# Patient Record
Sex: Female | Born: 1990 | Race: White | Hispanic: No | Marital: Married | Smoking: Current every day smoker
Health system: Southern US, Community
[De-identification: ages and names within clinical notes are randomized; demographics above are authoritative.]

## PROBLEM LIST (undated history)

## (undated) DIAGNOSIS — Z803 Family history of malignant neoplasm of breast: Secondary | ICD-10-CM

## (undated) DIAGNOSIS — G4730 Sleep apnea, unspecified: Secondary | ICD-10-CM

## (undated) DIAGNOSIS — E119 Type 2 diabetes mellitus without complications: Secondary | ICD-10-CM

## (undated) DIAGNOSIS — E282 Polycystic ovarian syndrome: Secondary | ICD-10-CM

## (undated) DIAGNOSIS — E669 Obesity, unspecified: Secondary | ICD-10-CM

## (undated) HISTORY — DX: Type 2 diabetes mellitus without complications: E11.9

## (undated) HISTORY — DX: Family history of malignant neoplasm of breast: Z80.3

## (undated) HISTORY — DX: Polycystic ovarian syndrome: E28.2

## (undated) HISTORY — DX: Sleep apnea, unspecified: G47.30

## (undated) HISTORY — DX: Obesity, unspecified: E66.9

---
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
## 2011-02-04 ENCOUNTER — Emergency Department (HOSPITAL_BASED_OUTPATIENT_CLINIC_OR_DEPARTMENT_OTHER)
Admission: EM | Admit: 2011-02-04 | Discharge: 2011-02-04 | Disposition: A | Discharge status: Home / Self Care | Attending: Emergency Medicine | Admitting: Emergency Medicine

## 2011-02-04 DIAGNOSIS — J020 Streptococcal pharyngitis: Secondary | ICD-10-CM | POA: Insufficient documentation

## 2011-02-04 DIAGNOSIS — F17200 Nicotine dependence, unspecified, uncomplicated: Secondary | ICD-10-CM | POA: Insufficient documentation

## 2011-02-04 DIAGNOSIS — R509 Fever, unspecified: Secondary | ICD-10-CM | POA: Insufficient documentation

## 2011-02-04 DIAGNOSIS — Z79899 Other long term (current) drug therapy: Secondary | ICD-10-CM | POA: Insufficient documentation

## 2011-02-04 DIAGNOSIS — J45909 Unspecified asthma, uncomplicated: Secondary | ICD-10-CM | POA: Insufficient documentation

## 2011-02-04 LAB — RAPID STREP SCREEN (MED CTR MEBANE ONLY): Streptococcus, Group A Screen (Direct): POSITIVE — AB

## 2011-02-04 MED ORDER — IBUPROFEN 800 MG PO TABS
ORAL_TABLET | ORAL | Status: AC
  Administered 2011-02-04: 800 mg
  Filled 2011-02-04: qty 1

## 2011-02-04 MED ORDER — PENICILLIN V POTASSIUM 500 MG PO TABS: 500.0000 mg | ORAL_TABLET | Freq: Four times a day (QID) | ORAL | Status: AC

## 2011-02-04 MED ORDER — IBUPROFEN 800 MG PO TABS: 800.0000 mg | ORAL_TABLET | Freq: Three times a day (TID) | ORAL | Status: AC

## 2011-02-04 NOTE — ED Provider Notes (Signed)
History     CSN: [id number]  Arrival date & time 02/04/11  1130   None     Chief Complaint  Patient presents with  . Fever  . Sore Throat  . Generalized Body Aches  . Dizziness    (Consider location/radiation/quality/duration/timing/severity/associated sxs/prior treatment) Patient is a 20 y.o. female presenting with pharyngitis. The history is provided by the patient. No language interpreter was used.  Sore Throat This is a new problem. The current episode started yesterday. The problem occurs constantly. The problem has been gradually worsening. Associated symptoms include fatigue, a fever, headaches and a sore throat. The symptoms are aggravated by nothing. She has tried acetaminophen for the symptoms. The treatment provided no relief.    Past Medical History  Diagnosis Date  . Asthma     History reviewed. No pertinent past surgical history.  No family history on file.  History  Substance Use Topics  . Smoking status: Current Everyday Smoker -- 0.5 packs/day  . Smokeless tobacco: Not on file  . Alcohol Use: No    OB History    Grav Para Term Preterm Abortions TAB SAB Ect Mult Living                  Review of Systems  Constitutional: Positive for fever and fatigue.  HENT: Positive for sore throat.   Neurological: Positive for headaches.  All other systems reviewed and are negative.    Allergies  Review of patient's allergies indicates no known allergies.  Home Medications   Current Outpatient Rx  Name Route Sig Dispense Refill  . ACETAMINOPHEN 500 MG PO TABS Oral Take 1,000 mg by mouth every 6 (six) hours as needed.      Maximino Greenland 18-103 MCG/ACT IN AERO Inhalation Inhale 2 puffs into the lungs every 6 (six) hours as needed.      Marland Kitchen FLUTICASONE-SALMETEROL 115-21 MCG/ACT IN AERO Inhalation Inhale 2 puffs into the lungs 2 (two) times daily.      . IBUPROFEN 800 MG PO TABS Oral Take 800 mg by mouth every 8 (eight) hours as needed.         BP 127/74  Pulse 128  Temp(Src) 101.8 F (38.8 C) (Oral)  Resp 18  Ht 5\' 2"  (1.575 m)  Wt 194 lb (87.998 kg)  BMI 35.48 kg/m2  SpO2 99%  LMP 01/07/2011  Physical Exam  Nursing note and vitals reviewed. Constitutional: She is oriented to person, place, and time. She appears well-developed.  HENT:  Head: Normocephalic.  Right Ear: External ear normal.  Mouth/Throat: Oropharyngeal exudate present.       Throat erythematous   Eyes: Conjunctivae are normal. Pupils are equal, round, and reactive to light.  Neck: Normal range of motion. Neck supple.  Cardiovascular: Normal rate and normal heart sounds.   Pulmonary/Chest: Effort normal.  Abdominal: Soft.  Musculoskeletal: Normal range of motion.  Neurological: She is alert and oriented to person, place, and time. She has normal reflexes.  Skin: Skin is warm.  Psychiatric: She has a normal mood and affect.    ED Course  Procedures (including critical care time)  Labs Reviewed - No data to display No results found.   No diagnosis found.    MDM  Strep positive,  Temp decreased with ibuprofen        Langston Masker, [place] 02/04/11 1440

## 2011-02-04 NOTE — ED Notes (Signed)
Trisha Mangle, PA-C at bedside for evaluation.

## 2011-02-04 NOTE — ED Notes (Signed)
Pt reports sore throat, fever, dizziness and generalized body aches that started yesterday.

## 2011-02-06 NOTE — ED Provider Notes (Signed)
Medical screening examination/treatment/procedure(s) were performed by non-physician practitioner and as supervising physician I was immediately available for consultation/collaboration.   Geoffery Lyons, MD 02/06/11 [phone]

## 2012-10-09 HISTORY — PX: TUBAL LIGATION: SHX77

## 2013-07-09 HISTORY — PX: CHOLECYSTECTOMY: SHX55

## 2015-02-09 DIAGNOSIS — E119 Type 2 diabetes mellitus without complications: Secondary | ICD-10-CM

## 2015-02-09 HISTORY — DX: Type 2 diabetes mellitus without complications: E11.9

## 2016-04-07 ENCOUNTER — Ambulatory Visit (INDEPENDENT_AMBULATORY_CARE_PROVIDER_SITE_OTHER): Admitting: Advanced Practice Midwife

## 2016-04-07 ENCOUNTER — Encounter: Payer: Self-pay | Admitting: Advanced Practice Midwife

## 2016-04-07 VITALS — BP 110/70 | HR 84 | Ht 62.0 in | Wt 227.0 lb

## 2016-04-07 DIAGNOSIS — Z8742 Personal history of other diseases of the female genital tract: Secondary | ICD-10-CM

## 2016-04-07 DIAGNOSIS — Z87898 Personal history of other specified conditions: Secondary | ICD-10-CM | POA: Diagnosis not present

## 2016-04-07 DIAGNOSIS — Z01419 Encounter for gynecological examination (general) (routine) without abnormal findings: Secondary | ICD-10-CM

## 2016-04-07 NOTE — Progress Notes (Signed)
Gynecology Annual Exam  PCP: Evelene CroonNIEMEYER, MEINDERT, MD  Chief Complaint:  Chief Complaint  Patient presents with  . Gynecologic Exam    History of Present Illness: Patient is a 26 y.o. G3P3003 presents for annual exam. The patient has no complaints today.   LMP: Patient's last menstrual period was 04/04/2016. Menarche:not applicable Interval: every month Duration of flow: 4 days Heavy Menses: no Clots: no Intermenstrual Bleeding: no Postcoital Bleeding: no Dysmenorrhea: no   The patient is sexually active. She currently uses Female sterilization: tubal ligation for contraception. The patient wears seatbelts: yes.   last pap: normal following colposcopy February 2017  The patient has regular exercise: yes.  The patient has ever been transfused or tattooed?: not asked.  The patient reports that domestic violence in her life is absent.   Review of Systems: ROS  Past Medical History:  Past Medical History:  Diagnosis Date  . Asthma   . Diabetes mellitus without complication (HCC) 2017  . Obesity   . Polycystic ovaries     Past Surgical History:  Past Surgical History:  Procedure Laterality Date  . CHOLECYSTECTOMY  07/2013  . TUBAL LIGATION  10/2012    Gynecologic History:  Patient's last menstrual period was 04/04/2016. Last Pap: 2017 Results were: no abnormalities   Obstetric History: [id number]  Family History:  Family History  Problem Relation Age of Onset  . Congestive Heart Failure Mother   . Diabetes Mother   . Hypertension Mother   . Skin cancer Mother   . Congestive Heart Failure Father   . Diabetes Father   . Hypertension Father   . Breast cancer Paternal Aunt   . Colon cancer Paternal Grandfather     Social History:  Social History   Social History  . Marital status: Married    Spouse name: N/A  . Number of children: N/A  . Years of education: N/A   Occupational History  . Not on file.   Social History Main Topics  . Smoking status:  Current Every Day Smoker    Packs/day: 0.50  . Smokeless tobacco: Not on file  . Alcohol use Yes  . Drug use: No  . Sexual activity: Yes     Comment: tubal ligation   Other Topics Concern  . Not on file   Social History Narrative  . No narrative on file    Allergies:  Allergies  Allergen Reactions  . Contrast Media [Iodinated Diagnostic Agents] Nausea And Vomiting    Medications: Prior to Admission medications   Medication Sig Start Date End Date Taking? Authorizing Provider  albuterol (PROVENTIL HFA;VENTOLIN HFA) 108 (90 Base) MCG/ACT inhaler Inhale 1-2 puffs into the lungs every 6 (six) hours as needed for wheezing or shortness of breath.   Yes Historical Provider, MD  cetirizine (ZYRTEC) 10 MG tablet  04/05/16  Yes Historical Provider, MD  fluticasone-salmeterol (ADVAIR HFA) 115-21 MCG/ACT inhaler Inhale 2 puffs into the lungs 2 (two) times daily.     Yes Historical Provider, MD  ibuprofen (ADVIL,MOTRIN) 800 MG tablet Take 800 mg by mouth every 8 (eight) hours as needed.     Yes Historical Provider, MD  meloxicam (MOBIC) 15 MG tablet Take 15 mg by mouth daily.   Yes Historical Provider, MD  ondansetron (ZOFRAN) 4 MG tablet take 1-2 tablets by mouth three times a day if needed 01/15/16  Yes Historical Provider, MD  ONGLYZA 5 MG TABS tablet Take 5 mg by mouth daily. 01/20/16  Yes Historical Provider,  MD  acetaminophen (TYLENOL) 500 MG tablet Take 1,000 mg by mouth every 6 (six) hours as needed.      Historical Provider, MD  albuterol-ipratropium (COMBIVENT) 18-103 MCG/ACT inhaler Inhale 2 puffs into the lungs every 6 (six) hours as needed.      Historical Provider, MD    Physical Exam Vitals:  Vitals:   04/07/16 1138  BP: 110/70  Pulse: 84   Patient's last menstrual period was 04/04/2016.  General: NAD HEENT: normocephalic, anicteric Thyroid: no enlargement, no palpable nodules Pulmonary: No increased work of breathing, CTAB Cardiovascular: RRR, distal pulses  2+ Breast: Breast symmetrical, no tenderness, no palpable nodules or masses, no skin or nipple retraction present, no nipple discharge.  No axillary or supraclavicular lymphadenopathy. Abdomen: NABS, soft, non-tender, non-distended.  Umbilicus without lesions.  No hepatomegaly, splenomegaly or masses palpable. No evidence of hernia  Genitourinary:  External: Normal external female genitalia.  Normal urethral meatus, normal  Bartholin's and Skene's glands.    Vagina: Normal vaginal mucosa, no evidence of prolapse.    Cervix: Grossly normal in appearance, no bleeding  Uterus: Non-enlarged, mobile, normal contour.  No CMT  Adnexa: ovaries non-enlarged, no adnexal masses  Rectal: deferred  Lymphatic: no evidence of inguinal lymphadenopathy Extremities: no edema, erythema, or tenderness Neurologic: Grossly intact Psychiatric: mood appropriate, affect full   Assessment: 26 y.o. [id number] Annual well woman gyn with PAP   Plan: Problem List Items Addressed This Visit    None       1) Continue healthy lifestyle diet and exercise program  2) STI screening was offered, declined  3) Pap - ASCCP guidelines and rational discussed.  Patient opts for annual screening interval  4) Follow up 1 year for routine annual exam   Tresea Mall, CNM

## 2016-04-09 LAB — PAP IG AND HPV HIGH-RISK
HPV, high-risk: NEGATIVE
PAP SMEAR COMMENT: 0

## 2017-07-15 ENCOUNTER — Ambulatory Visit: Payer: 59 | Attending: Neurology

## 2017-07-15 DIAGNOSIS — G4733 Obstructive sleep apnea (adult) (pediatric): Secondary | ICD-10-CM | POA: Diagnosis not present

## 2017-07-29 ENCOUNTER — Encounter: Admitting: Obstetrics and Gynecology

## 2017-08-05 ENCOUNTER — Ambulatory Visit: Payer: 59 | Attending: Neurology

## 2017-08-05 DIAGNOSIS — G4733 Obstructive sleep apnea (adult) (pediatric): Secondary | ICD-10-CM | POA: Insufficient documentation

## 2017-08-19 ENCOUNTER — Encounter: Payer: Self-pay | Admitting: Obstetrics and Gynecology

## 2017-08-19 ENCOUNTER — Ambulatory Visit (INDEPENDENT_AMBULATORY_CARE_PROVIDER_SITE_OTHER): Payer: Managed Care, Other (non HMO) | Admitting: Obstetrics and Gynecology

## 2017-08-19 VITALS — BP 118/72 | HR 98 | Ht 62.0 in | Wt 234.0 lb

## 2017-08-19 DIAGNOSIS — N914 Secondary oligomenorrhea: Secondary | ICD-10-CM

## 2017-08-19 DIAGNOSIS — Z3202 Encounter for pregnancy test, result negative: Secondary | ICD-10-CM | POA: Diagnosis not present

## 2017-08-19 DIAGNOSIS — Z87898 Personal history of other specified conditions: Secondary | ICD-10-CM

## 2017-08-19 DIAGNOSIS — Z8742 Personal history of other diseases of the female genital tract: Secondary | ICD-10-CM

## 2017-08-19 LAB — POCT URINE PREGNANCY: PREG TEST UR: NEGATIVE

## 2017-08-19 MED ORDER — MEDROXYPROGESTERONE ACETATE 10 MG PO TABS: 10.0000 mg | ORAL_TABLET | Freq: Every day | ORAL | 0 refills | Status: DC

## 2017-08-19 NOTE — Progress Notes (Signed)
Obstetrics & Gynecology Office Visit   Chief Complaint  Patient presents with  . Referral    Abnormal pap smears, [facility]    History of Present Illness: 27 y.o. [phone] female who presents in referral from [facility], for a history of abnormal pap smears. She also notes that while she has PCOS, she normally has a menstruation every month.  She has not had a menstruation since April and she feels occasional lower uterine cramping. She denies abnormal discharge, vaginal symptoms. Her discomfort is just momentary and generally happens once per day.   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 Asthma   . Diabetes mellitus without complication (HCC) 2017  . Obesity   . Polycystic ovaries   . Sleep apnea     Past Surgical History:  Procedure Laterality Date  . CHOLECYSTECTOMY  07/2013  . TUBAL LIGATION  10/2012    Gynecologic History: Patient's last menstrual period was 06/01/2017 (approximate).  Obstetric History: [id number]  Family History  Problem Relation Age of Onset  . Congestive Heart Failure Mother   . Diabetes Mother   . Hypertension Mother   . Skin cancer Mother   . Congestive Heart Failure Father   . Diabetes Father   . Hypertension Father   . Breast cancer Paternal Aunt [id number]  . Colon cancer Paternal Grandfather     Social History   Socioeconomic History  . Marital status: Married    Spouse name: Not on file  . Number of children: Not on file  . Years of education: Not on file  . Highest education level: Not on file  Occupational History  . Not on file  Social Needs  . Financial resource strain: Not on file    . Food insecurity:    Worry: Not on file    Inability: Not on file  . Transportation needs:    Medical: Not on file    Non-medical: Not on file  Tobacco Use  . Smoking status: Former Smoker    Packs/day: 0.50  . Smokeless tobacco: Never Used  . Tobacco comment: quit 10/2015  Substance and Sexual Activity  . Alcohol use: Yes    Comment: occ  . Drug use: No  . Sexual activity: Yes    Comment: tubal ligation  Lifestyle  . Physical activity:    Days per week: 6 days    Minutes per session: 30 min  . Stress: Not on file  Relationships  . Social connections:    Talks on phone: Not on file    Gets together: Not on file    Attends religious service: Not on file    Active member of club or organization: Not on file    Attends meetings of clubs or organizations: Not on file    Relationship status: Not on file  . Intimate partner violence:    Fear of current or ex partner: Not on file    Emotionally abused: Not on file    Physically abused: Not on file    Forced sexual activity: Not on file  Other Topics Concern  . Not on file  Social History Narrative  .  Not on file    Allergies  Allergen Reactions  . Iodinated Diagnostic Agents Nausea And Vomiting    Contrast Dye  . Shellfish Allergy Nausea Only    Betadine OK    Prior to Admission medications   Medication Sig Start Date End Date Taking? Authorizing Provider  albuterol (PROVENTIL HFA;VENTOLIN HFA) 108 (90 Base) MCG/ACT inhaler Inhale 1-2 puffs into the lungs every 6 (six) hours as needed for wheezing or shortness of breath.   Yes [provider]  cetirizine (ZYRTEC) 10 MG tablet  04/05/16  Yes [provider]  Fluticasone-Salmeterol (ADVAIR DISKUS) 250-50 MCG/DOSE AEPB Inhale 1 puff into the lungs every 12 (twelve) hours. 03/09/17 03/09/18 Yes [provider]  metFORMIN (GLUCOPHAGE-XR) 500 MG 24 hr tablet Take 2 tablets by mouth daily. 07/07/17  Yes [provider]  montelukast  (SINGULAIR) 10 MG tablet Take 1 tablet by mouth at bedtime. 02/16/17 02/16/18 Yes [provider]  ondansetron (ZOFRAN) 4 MG tablet take 1-2 tablets by mouth three times a day if needed 01/15/16  Yes [provider]  acetaminophen (TYLENOL) 500 MG tablet Take 1,000 mg by mouth every 6 (six) hours as needed.      [provider]  ketoconazole (NIZORAL) 2 % shampoo Apply 1 application topically daily. 07/07/17   [provider]  medroxyPROGESTERone (PROVERA) 10 MG tablet Take 1 tablet (10 mg total) by mouth daily for 10 days. 08/19/17 08/29/17  Conard Novak, MD    Review of Systems  Constitutional: Negative.   HENT: Negative.   Eyes: Negative.   Respiratory: Negative.   Cardiovascular: Negative.   Gastrointestinal: Negative.   Genitourinary: Negative.        See HPI  Musculoskeletal: Negative.   Skin: Negative.   Neurological: Negative.   Psychiatric/Behavioral: Negative.      Physical Exam BP 118/72 (BP Location: Left Arm, Patient Position: Sitting, Cuff Size: Large)   Pulse 98   Ht 5\' 2"  (1.575 m)   Wt 234 lb (106.1 kg)   LMP 06/01/2017 (Approximate)   SpO2 98%   BMI 42.80 kg/m  Patient's last menstrual period was 06/01/2017 (approximate). Physical Exam  Constitutional: She is oriented to person, place, and time. She appears well-developed and well-nourished. No distress.  HENT:  Head: Normocephalic and atraumatic.  Eyes: Conjunctivae are normal. No scleral icterus.  Pulmonary/Chest: No respiratory distress.  Musculoskeletal: Normal range of motion. She exhibits no edema.  Neurological: She is alert and oriented to person, place, and time. No cranial nerve deficit.  Psychiatric: She has a normal mood and affect. Her behavior is normal. Judgment normal.   POCT urine pregnancy: negative  Assessment: 27 y.o. G79P3003 female here for  1. Secondary oligomenorrhea   2. History of abnormal cervical Pap smear      Plan: Problem List Items  Addressed This Visit      Other   History of abnormal cervical Pap smear    Other Visit Diagnoses    Secondary oligomenorrhea    -  Primary   Relevant Medications   medroxyPROGESTERone (PROVERA) 10 MG tablet   Other Relevant Orders   POCT urine pregnancy (Completed)     History of abnormal pap smear: Given her recent normal pap smears, she is fine, per ASCCP guidelines, to follow up 3 years from her pap smear in 03/2016 to repeat her pap smear.  I offered to perform a pap smear after this discussion today and she declined.  Secondary oligomenorrhea: she does have a  history of PCOS.  She has what sounds like mild uterine discomfort.  I recommended a withdrawal bleeding provoked by Provera.  She agreed to this plan. So, I ordered provera 10 mg po daily x 10 days to affect a withdrawal bleed.  Follow up per Dr. Larwance Sachs. If she continues to have irregular menses associated with PCOS, I am happy to co-manage with Dr. Larwance Sachs, if he would prefer that.  Otherwise, I defer to his management.  20 minutes spent in face to face discussion with > 50% spent in counseling,management, and coordination of care of her history of abnormal pap smears and secondary oligomenorrhea.   Thomasene Mohair, MD 08/19/2017 4:34 PM     CC: Kandyce Rud, MD [facility]

## 2017-08-29 ENCOUNTER — Encounter: Payer: Self-pay | Admitting: Obstetrics and Gynecology

## 2017-09-14 ENCOUNTER — Telehealth: Payer: Self-pay | Admitting: Obstetrics and Gynecology

## 2017-09-14 NOTE — Telephone Encounter (Signed)
Left generic VM to answer patient questions.

## 2017-09-14 NOTE — Telephone Encounter (Signed)
Pt calling [facility] back about what kind of surgery she had back in 2014

## 2017-09-14 NOTE — Telephone Encounter (Signed)
Patient wanted to know how her BTL was accomplished in 2014. I discussed that the records indicated that she has a postpartum BTL using [employer].  All questions answered.

## 2018-08-25 NOTE — H&P (Signed)
Heather Ewing is a 28 y.o. female here for Follow-up . HeatherBryantis a 28 y.o.femalehere for Rf Dr Merton Border menses .pt here for consultation for AUB . Pt with a long h/o f skipping menses in past . Heavy bleeding . She smokes 5-10 cigarettes daily. S/p BTL Told that she has PCOS . G3 P3 svd  SHe has use Mirena 2 times , last time with alot of pain and bleeding  u/s today :  Uterus anteverted  Endometrium=8.3mm  No free fluid seen  Lt ovary volume=14.45ml  Rt ovary volume=13.62ml     Past Medical History:  has a past medical history of Abnormal cytology, ADHD, Allergic state, Anxiety, Asthma, unspecified asthma severity, unspecified whether complicated, unspecified whether persistent, PCOS (polycystic ovarian syndrome), Pituitary tumor, and Sciatica.  Past Surgical History:  has a past surgical history that includes Tubal ligation; Cholecystectomy; and Cervical biopsy w/ loop electrode excision. Family History: family history includes Asthma in her brother, brother, sister, sister, sister, and sister; COPD in her mother; Cirrhosis in her father; Diabetes type II in her father, maternal grandmother, mother, and paternal grandmother; Heart disease in her father and maternal grandmother; Heart failure in her mother; High blood pressure (Hypertension) in her maternal grandmother and mother; Skin cancer in her mother; Stroke in her paternal grandmother. Social History:  reports that she has been smoking. She started smoking about 15 years ago. She has been smoking about 0.50 packs per day. She has never used smokeless tobacco. She reports current alcohol use of about 1.0 standard drinks of alcohol per week. She reports that she does not use drugs. OB/GYN History:          OB History    Gravida  3   Para  3   Term      Preterm      AB      Living  3     SAB      TAB      Ectopic      Molar      Multiple      Live Births  3           Allergies: is allergic to dye and shellfish containing products. Medications:  Current Outpatient Medications:  .  ADVAIR DISKUS 250-50 mcg/dose diskus inhaler, USE 1 INHALATION EVERY 12 HOURS, Disp: 180 each, Rfl: 1 .  cetirizine (ZYRTEC) 10 MG tablet, TAKE 1 TABLET BY MOUTH EVERY DAY, Disp: 30 tablet, Rfl: 2 .  fluticasone (FLONASE) 50 mcg/actuation nasal spray, Place 1 spray into both nostrils 2 (two) times daily, Disp: , Rfl:  .  lisdexamfetamine (VYVANSE) 60 MG capsule, Take 60 mg by mouth every morning, Disp: , Rfl:  .  metFORMIN (GLUCOPHAGE-XR) 500 MG XR tablet, Take 2 tablets (1,000 mg total) by mouth daily with dinner, Disp: 180 tablet, Rfl: 1 .  montelukast (SINGULAIR) 10 mg tablet, TAKE 1 TABLET BY MOUTH AT BEDTIME, Disp: 30 tablet, Rfl: 3 .  ondansetron (ZOFRAN-ODT) 4 MG disintegrating tablet, Take 1 tablet (4 mg total) by mouth every 8 (eight) hours as needed for Nausea, Disp: 12 tablet, Rfl: 0 .  VENTOLIN HFA 90 mcg/actuation inhaler, INHALE 2 PUFFS BY MOUTH EVERY 4 HOURS AS NEEDED, Disp: 18 Inhaler, Rfl: 2 .  busPIRone (BUSPAR) 7.5 MG tablet, Take 1 tablet (7.5 mg total) by mouth 2 (two) times daily (Patient not taking: Reported on 07/24/2018  ), Disp: 60 tablet, Rfl: 2 .  ketoconazole (NIZORAL) 2 % shampoo, Leave on for  5 minutes, then rinse. (Patient not taking: Reported on 07/24/2018  ), Disp: 120 mL, Rfl: 5 .  norgestimate-ethinyl estradioL (SPRINTEC 0.25/35, 28,) 0.25-35 mg-mcg tablet, Take 1 tablet by mouth once daily (Patient not taking: Reported on 07/24/2018  ), Disp: 1 Package, Rfl: 2 .  propranolol (INDERAL) 20 MG tablet, Take 1 tablet (20 mg total) by mouth 2 (two) times daily, Disp: 60 tablet, Rfl: 1  Review of Systems: General:                      No fatigue or weight loss Eyes:                           No vision changes Ears:                            No hearing difficulty Respiratory:                No cough or shortness of breath Pulmonary:                   No asthma or shortness of breath Cardiovascular:           No chest pain, palpitations, dyspnea on exertion Gastrointestinal:          No abdominal bloating, chronic diarrhea, constipations, masses, pain or hematochezia Genitourinary:             No hematuria, dysuria, abnormal vaginal discharge, pelvic pain, Menometrorrhagia Lymphatic:                   No swollen lymph nodes Musculoskeletal:         No muscle weakness Neurologic:                  No extremity weakness, syncope, seizure disorder Psychiatric:                  No history of depression, delusions or suicidal/homicidal ideation    Exam:      Vitals:   08/28/18 1656  BP: 111/74  Pulse: 96    Body mass index is 40.97 kg/m.  WDWN white/  female in NAD   Lungs: CTA  CV : RRR without murmur   Neck:  no thyromegaly Abdomen: soft , no mass, normal active bowel sounds,  non-tender, no rebound tenderness Pelvic: tanner stage 5 ,  External genitalia: vulva /labia no lesions Urethra: no prolapse Vagina: normal physiologic d/c, TVH if need be  Cervix: no lesions, no cervical motion tenderness   Uterus: normal size shape and contour, non-tender Adnexa: no mass,  non-tender     Impression:   The encounter diagnosis was Abnormal uterine bleeding (AUB), unspecified.  No endometrial pathology noted on U/S   Plan:   I have spoken with the patient regarding treatment options including expectant management, hormonal options,including Porgesterone tx , repeat IUPenni Bombard( Liletta) or surgical intervention.Given her tobacco use and her not wanting to quit  And she has a difficult time remembering to take meds she  elects to proceed with definitive procedure TVH . I have  discussed the risks of the procedure

## 2018-08-31 ENCOUNTER — Encounter
Admission: RE | Admit: 2018-08-31 | Discharge: 2018-08-31 | Disposition: A | Discharge status: Home / Self Care | Payer: 59 | Source: Ambulatory Visit | Attending: Obstetrics and Gynecology | Admitting: Obstetrics and Gynecology

## 2018-08-31 ENCOUNTER — Other Ambulatory Visit: Payer: Self-pay

## 2018-08-31 DIAGNOSIS — Z01812 Encounter for preprocedural laboratory examination: Secondary | ICD-10-CM | POA: Diagnosis not present

## 2018-08-31 DIAGNOSIS — Z1159 Encounter for screening for other viral diseases: Secondary | ICD-10-CM | POA: Diagnosis not present

## 2018-08-31 LAB — CBC
HCT: 40.8 % (ref 36.0–46.0)
Hemoglobin: 13.4 g/dL (ref 12.0–15.0)
MCH: 29.6 pg (ref 26.0–34.0)
MCHC: 32.8 g/dL (ref 30.0–36.0)
MCV: 90.1 fL (ref 80.0–100.0)
Platelets: 247 10*3/uL (ref 150–400)
RBC: 4.53 MIL/uL (ref 3.87–5.11)
RDW: 11.6 % (ref 11.5–15.5)
WBC: 6.9 10*3/uL (ref 4.0–10.5)
nRBC: 0 % (ref 0.0–0.2)

## 2018-08-31 LAB — TYPE AND SCREEN
ABO/RH(D): O POS
Antibody Screen: NEGATIVE

## 2018-08-31 LAB — BASIC METABOLIC PANEL
Anion gap: 10 (ref 5–15)
BUN: 9 mg/dL (ref 6–20)
CO2: 26 mmol/L (ref 22–32)
Calcium: 9.1 mg/dL (ref 8.9–10.3)
Chloride: 103 mmol/L (ref 98–111)
Creatinine, Ser: 0.75 mg/dL (ref 0.44–1.00)
GFR calc Af Amer: >60 mL/min (ref >60)
GFR calc non Af Amer: >60 mL/min (ref >60)
Glucose, Bld: 141 mg/dL — ABNORMAL HIGH (ref 70–99)
Potassium: 4.1 mmol/L (ref 3.5–5.1)
Sodium: 139 mmol/L (ref 135–145)

## 2018-08-31 LAB — SARS CORONAVIRUS 2 (TAT 6-24 HRS): SARS Coronavirus 2: NEGATIVE

## 2018-08-31 NOTE — Patient Instructions (Signed)
Your procedure is scheduled on: Mon 7/27 Report to Day Surgery. To find out your arrival time please call [phone] between 1PM - 3PM on Fri. 7/24.  Remember: Instructions that are not followed completely may result in serious medical risk,  up to and including death, or upon the discretion of your surgeon and anesthesiologist your  surgery may need to be rescheduled.     _X__ 1. Do not eat food after midnight the night before your procedure.                 No gum chewing or hard candies. You may drink clear liquids up to 2 hours                 before you are scheduled to arrive for your surgery- DO not drink clear                 liquids within 2 hours of the start of your surgery.                 Clear Liquids include:  water, apple juice without pulp, clear carbohydrate                 drink such as Clearfast of Gatorade, Black Coffee or Tea (Do not add                 anything to coffee or tea).  __X__2.  On the morning of surgery brush your teeth with toothpaste and water, you                may rinse your mouth with mouthwash if you wish.  Do not swallow any toothpaste of mouthwash.     _X__ 3.  No Alcohol for 24 hours before or after surgery.   _X__ 4.  Do Not Smoke or use e-cigarettes For 24 Hours Prior to Your Surgery.                 Do not use any chewable tobacco products for at least 6 hours prior to                 surgery.  ____  5.  Bring all medications with you on the day of surgery if instructed.   _x___  6.  Notify your doctor if there is any change in your medical condition      (cold, fever, infections).     Do not wear jewelry, make-up, hairpins, clips or nail polish. Do not wear lotions, powders, or perfumes. You may wear deodorant. Do not shave 48 hours prior to surgery. Men may shave face and neck. Do not bring valuables to the hospital.    [facility] is not responsible for any belongings or valuables.  Contacts, dentures  or bridgework may not be worn into surgery. Leave your suitcase in the car. After surgery it may be brought to your room. For patients admitted to the hospital, discharge time is determined by your treatment team.   Patients discharged the day of surgery will not be allowed to drive home.   Please read over the following fact sheets that you were given:    _x___ Take these medicines the morning of surgery with A SIP OF WATER:    1. cetirizine (ZYRTEC) 10 MG tablet  2. montelukast (SINGULAIR) 10 MG tablet  3.   4.  5.  6.  ____ Fleet Enema (as directed)   ____ Use CHG Soap as directed  _x___ Use inhalers on the day of surgery albuterol (PROVENTIL HFA;VENTOLIN HFA) 108 (90 Base) MCG/ACT inhaler and bring with you.  Fluticasone-Salmeterol (ADVAIR) 250-50 MCG/DOSE AEPB  _x___ Stop metformin 2 days prior to surgery  Last dose Friday  ____ Take 1/2 of usual insulin dose the night before surgery. No insulin the morning          of surgery.   ____ Stop Coumadin/Plavix/aspirin on   ____ Stop Anti-inflammatories on    ____ Stop supplements until after surgery.    _x___ Bring C-Pap to the hospital.

## 2018-09-03 ENCOUNTER — Encounter: Payer: Self-pay | Admitting: Anesthesiology

## 2018-09-04 ENCOUNTER — Observation Stay: Payer: 59 | Admitting: Anesthesiology

## 2018-09-04 ENCOUNTER — Other Ambulatory Visit: Payer: Self-pay

## 2018-09-04 ENCOUNTER — Encounter
Admission: RE | Disposition: A | Discharge status: Home / Self Care | Payer: Self-pay | Source: Ambulatory Visit | Attending: Obstetrics and Gynecology

## 2018-09-04 ENCOUNTER — Observation Stay
Admission: RE | Admit: 2018-09-04 | Discharge: 2018-09-04 | Disposition: A | Discharge status: Home / Self Care | Payer: 59 | Source: Ambulatory Visit | Attending: Obstetrics and Gynecology | Admitting: Obstetrics and Gynecology

## 2018-09-04 ENCOUNTER — Encounter: Payer: Self-pay | Admitting: *Deleted

## 2018-09-04 DIAGNOSIS — Z823 Family history of stroke: Secondary | ICD-10-CM | POA: Diagnosis not present

## 2018-09-04 DIAGNOSIS — J45909 Unspecified asthma, uncomplicated: Secondary | ICD-10-CM | POA: Diagnosis not present

## 2018-09-04 DIAGNOSIS — Z7951 Long term (current) use of inhaled steroids: Secondary | ICD-10-CM | POA: Insufficient documentation

## 2018-09-04 DIAGNOSIS — F17210 Nicotine dependence, cigarettes, uncomplicated: Secondary | ICD-10-CM | POA: Diagnosis not present

## 2018-09-04 DIAGNOSIS — Z8379 Family history of other diseases of the digestive system: Secondary | ICD-10-CM | POA: Insufficient documentation

## 2018-09-04 DIAGNOSIS — F909 Attention-deficit hyperactivity disorder, unspecified type: Secondary | ICD-10-CM | POA: Diagnosis not present

## 2018-09-04 DIAGNOSIS — Z808 Family history of malignant neoplasm of other organs or systems: Secondary | ICD-10-CM | POA: Diagnosis not present

## 2018-09-04 DIAGNOSIS — Z9049 Acquired absence of other specified parts of digestive tract: Secondary | ICD-10-CM | POA: Insufficient documentation

## 2018-09-04 DIAGNOSIS — N920 Excessive and frequent menstruation with regular cycle: Principal | ICD-10-CM | POA: Insufficient documentation

## 2018-09-04 DIAGNOSIS — F419 Anxiety disorder, unspecified: Secondary | ICD-10-CM | POA: Insufficient documentation

## 2018-09-04 DIAGNOSIS — Z79899 Other long term (current) drug therapy: Secondary | ICD-10-CM | POA: Insufficient documentation

## 2018-09-04 DIAGNOSIS — Z7984 Long term (current) use of oral hypoglycemic drugs: Secondary | ICD-10-CM | POA: Diagnosis not present

## 2018-09-04 DIAGNOSIS — Z8249 Family history of ischemic heart disease and other diseases of the circulatory system: Secondary | ICD-10-CM | POA: Insufficient documentation

## 2018-09-04 DIAGNOSIS — Z825 Family history of asthma and other chronic lower respiratory diseases: Secondary | ICD-10-CM | POA: Diagnosis not present

## 2018-09-04 DIAGNOSIS — M5430 Sciatica, unspecified side: Secondary | ICD-10-CM | POA: Diagnosis not present

## 2018-09-04 DIAGNOSIS — Z91013 Allergy to seafood: Secondary | ICD-10-CM | POA: Diagnosis not present

## 2018-09-04 DIAGNOSIS — Z833 Family history of diabetes mellitus: Secondary | ICD-10-CM | POA: Insufficient documentation

## 2018-09-04 DIAGNOSIS — Z91041 Radiographic dye allergy status: Secondary | ICD-10-CM | POA: Insufficient documentation

## 2018-09-04 DIAGNOSIS — Z9889 Other specified postprocedural states: Secondary | ICD-10-CM

## 2018-09-04 HISTORY — PX: VAGINAL HYSTERECTOMY: SHX2639

## 2018-09-04 LAB — POCT PREGNANCY, URINE: Preg Test, Ur: NEGATIVE

## 2018-09-04 LAB — GLUCOSE, CAPILLARY
Glucose-Capillary: 120 mg/dL — ABNORMAL HIGH (ref 70–99)
Glucose-Capillary: 164 mg/dL — ABNORMAL HIGH (ref 70–99)

## 2018-09-04 LAB — ABO/RH: ABO/RH(D): O POS

## 2018-09-04 SURGERY — HYSTERECTOMY, VAGINAL
Anesthesia: General | Laterality: Right

## 2018-09-04 MED ORDER — PROPOFOL 10 MG/ML IV BOLUS
INTRAVENOUS | Status: DC | PRN
  Administered 2018-09-04: 150 mg via INTRAVENOUS
  Administered 2018-09-04: 50 mg via INTRAVENOUS

## 2018-09-04 MED ORDER — FENTANYL CITRATE (PF) 100 MCG/2ML IJ SOLN
INTRAMUSCULAR | Status: DC | PRN
  Administered 2018-09-04 (×2): 50 ug via INTRAVENOUS

## 2018-09-04 MED ORDER — FENTANYL CITRATE (PF) 100 MCG/2ML IJ SOLN
INTRAMUSCULAR | Status: AC
  Administered 2018-09-04: 25 ug via INTRAVENOUS
  Filled 2018-09-04: qty 2

## 2018-09-04 MED ORDER — ACETAMINOPHEN 500 MG PO TABS
ORAL_TABLET | ORAL | Status: AC
  Administered 2018-09-04: 1000 mg via ORAL
  Filled 2018-09-04: qty 2

## 2018-09-04 MED ORDER — CEFAZOLIN SODIUM-DEXTROSE 2-4 GM/100ML-% IV SOLN
2.0000 g | Freq: Once | INTRAVENOUS | Status: AC
  Administered 2018-09-04: 2 g via INTRAVENOUS

## 2018-09-04 MED ORDER — GABAPENTIN 300 MG PO CAPS
300.0000 mg | ORAL_CAPSULE | ORAL | Status: AC
  Administered 2018-09-04: 300 mg via ORAL

## 2018-09-04 MED ORDER — GABAPENTIN 300 MG PO CAPS
ORAL_CAPSULE | ORAL | Status: AC
  Administered 2018-09-04: 300 mg via ORAL
  Filled 2018-09-04: qty 1

## 2018-09-04 MED ORDER — ROCURONIUM BROMIDE 100 MG/10ML IV SOLN
INTRAVENOUS | Status: DC | PRN
  Administered 2018-09-04: 20 mg via INTRAVENOUS
  Administered 2018-09-04: 30 mg via INTRAVENOUS

## 2018-09-04 MED ORDER — LIDOCAINE-EPINEPHRINE 1 %-1:100000 IJ SOLN
INTRAMUSCULAR | Status: DC | PRN
  Administered 2018-09-04: 11 mL

## 2018-09-04 MED ORDER — ONDANSETRON HCL 4 MG/2ML IJ SOLN
INTRAMUSCULAR | Status: DC | PRN
  Administered 2018-09-04: 4 mg via INTRAVENOUS

## 2018-09-04 MED ORDER — FAMOTIDINE 20 MG PO TABS
20.0000 mg | ORAL_TABLET | Freq: Once | ORAL | Status: AC
  Administered 2018-09-04: 20 mg via ORAL

## 2018-09-04 MED ORDER — FENTANYL CITRATE (PF) 100 MCG/2ML IJ SOLN
25.0000 ug | INTRAMUSCULAR | Status: DC | PRN
  Administered 2018-09-04 (×4): 25 ug via INTRAVENOUS

## 2018-09-04 MED ORDER — MIDAZOLAM HCL 2 MG/2ML IJ SOLN
INTRAMUSCULAR | Status: DC | PRN
  Administered 2018-09-04: 2 mg via INTRAVENOUS

## 2018-09-04 MED ORDER — OXYCODONE HCL 5 MG PO TABS
ORAL_TABLET | ORAL | Status: AC
  Filled 2018-09-04: qty 1

## 2018-09-04 MED ORDER — LIDOCAINE HCL (CARDIAC) PF 100 MG/5ML IV SOSY
PREFILLED_SYRINGE | INTRAVENOUS | Status: DC | PRN
  Administered 2018-09-04: 100 mg via INTRAVENOUS

## 2018-09-04 MED ORDER — KETOROLAC TROMETHAMINE 30 MG/ML IJ SOLN
INTRAMUSCULAR | Status: AC
  Filled 2018-09-04: qty 1

## 2018-09-04 MED ORDER — SUGAMMADEX SODIUM 200 MG/2ML IV SOLN
INTRAVENOUS | Status: DC | PRN
  Administered 2018-09-04: 200 mg via INTRAVENOUS

## 2018-09-04 MED ORDER — MIDAZOLAM HCL 2 MG/2ML IJ SOLN
INTRAMUSCULAR | Status: AC
  Filled 2018-09-04: qty 2

## 2018-09-04 MED ORDER — OXYCODONE HCL 5 MG PO TABS
5.0000 mg | ORAL_TABLET | Freq: Once | ORAL | Status: AC
  Administered 2018-09-04: 13:00:00 5 mg via ORAL

## 2018-09-04 MED ORDER — ONDANSETRON HCL 4 MG/2ML IJ SOLN: 4.0000 mg | Freq: Once | INTRAMUSCULAR | Status: DC | PRN

## 2018-09-04 MED ORDER — LACTATED RINGERS IV SOLN
INTRAVENOUS | Status: DC | PRN
  Administered 2018-09-04: 11:00:00 via INTRAVENOUS

## 2018-09-04 MED ORDER — KETOROLAC TROMETHAMINE 30 MG/ML IJ SOLN
INTRAMUSCULAR | Status: DC | PRN
  Administered 2018-09-04: 30 mg via INTRAVENOUS

## 2018-09-04 MED ORDER — FAMOTIDINE 20 MG PO TABS
ORAL_TABLET | ORAL | Status: AC
  Administered 2018-09-04: 20 mg via ORAL
  Filled 2018-09-04: qty 1

## 2018-09-04 MED ORDER — DEXAMETHASONE SODIUM PHOSPHATE 10 MG/ML IJ SOLN
INTRAMUSCULAR | Status: DC | PRN
  Administered 2018-09-04: 10 mg via INTRAVENOUS

## 2018-09-04 MED ORDER — ACETAMINOPHEN 500 MG PO TABS
1000.0000 mg | ORAL_TABLET | ORAL | Status: AC
  Administered 2018-09-04: 1000 mg via ORAL

## 2018-09-04 MED ORDER — CEFAZOLIN SODIUM-DEXTROSE 2-4 GM/100ML-% IV SOLN
INTRAVENOUS | Status: AC
  Filled 2018-09-04: qty 100

## 2018-09-04 MED ORDER — SODIUM CHLORIDE 0.9 % IV SOLN
INTRAVENOUS | Status: DC
  Administered 2018-09-04: 10:00:00 via INTRAVENOUS

## 2018-09-04 SURGICAL SUPPLY — 36 items
BAG URINE DRAINAGE (UROLOGICAL SUPPLIES) ×2 IMPLANT
CANISTER SUCT 1200ML W/VALVE (MISCELLANEOUS) ×2 IMPLANT
CATH FOLEY 2WAY  5CC 16FR (CATHETERS) ×1
CATH FOLEY 2WAY 5CC 16FR (CATHETERS) ×1
CATH ROBINSON RED A/P 16FR (CATHETERS) ×2 IMPLANT
CATH URTH 16FR FL 2W BLN LF (CATHETERS) ×1 IMPLANT
COVER LIGHT HANDLE STERIS (MISCELLANEOUS) ×2 IMPLANT
COVER WAND RF STERILE (DRAPES) ×2 IMPLANT
DRAPE PERI LITHO V/GYN (MISCELLANEOUS) ×2 IMPLANT
DRAPE SURG 17X11 SM STRL (DRAPES) ×2 IMPLANT
DRAPE UNDER BUTTOCK W/FLU (DRAPES) ×2 IMPLANT
ELECT REM PT RETURN 9FT ADLT (ELECTROSURGICAL) ×2
ELECTRODE REM PT RTRN 9FT ADLT (ELECTROSURGICAL) ×1 IMPLANT
GLOVE BIO SURGEON STRL SZ8 (GLOVE) ×2 IMPLANT
GOWN STRL REUS W/ TWL LRG LVL3 (GOWN DISPOSABLE) ×3 IMPLANT
GOWN STRL REUS W/ TWL XL LVL3 (GOWN DISPOSABLE) ×1 IMPLANT
GOWN STRL REUS W/TWL LRG LVL3 (GOWN DISPOSABLE) ×6
GOWN STRL REUS W/TWL XL LVL3 (GOWN DISPOSABLE) ×2
KIT TURNOVER CYSTO (KITS) ×2 IMPLANT
LABEL OR SOLS (LABEL) ×2 IMPLANT
NEEDLE HYPO 22GX1.5 SAFETY (NEEDLE) ×2 IMPLANT
PACK BASIN MINOR ARMC (MISCELLANEOUS) ×2 IMPLANT
PAD OB MATERNITY 4.3X12.25 (PERSONAL CARE ITEMS) ×2 IMPLANT
PAD PREP 24X41 OB/GYN DISP (PERSONAL CARE ITEMS) ×2 IMPLANT
SOL PREP PVP 2OZ (MISCELLANEOUS) ×2
SOLUTION PREP PVP 2OZ (MISCELLANEOUS) ×1 IMPLANT
SURGILUBE 2OZ TUBE FLIPTOP (MISCELLANEOUS) ×2 IMPLANT
SUT PDS 2-0 27IN (SUTURE) ×2 IMPLANT
SUT VIC AB 0 CT1 27 (SUTURE) ×6
SUT VIC AB 0 CT1 27XCR 8 STRN (SUTURE) ×2 IMPLANT
SUT VIC AB 0 CT1 36 (SUTURE) ×4 IMPLANT
SUT VIC AB 2-0 SH 27 (SUTURE) ×6
SUT VIC AB 2-0 SH 27XBRD (SUTURE) ×3 IMPLANT
SYR 10ML LL (SYRINGE) ×2 IMPLANT
SYR CONTROL 10ML (SYRINGE) ×2 IMPLANT
WATER STERILE IRR 1000ML POUR (IV SOLUTION) ×2 IMPLANT

## 2018-09-04 NOTE — Progress Notes (Signed)
Ready for [facility] and bilateral salpingectomy. Neg HCG and Neg Covid . Labs reviewed . Precede with surgery

## 2018-09-04 NOTE — Op Note (Signed)
NAME: Heather Ewing, Heather Ewing MEDICAL RECORD KA:[id number] ACCOUNT [id number] DATE OF BIRTH:1990/08/31 FACILITY: [facility] LOCATION: [facility] PHYSICIAN:Latresa Gasser Josefine Class, MD  OPERATIVE REPORT  DATE OF PROCEDURE:  09/04/2018  PREOPERATIVE DIAGNOSIS:  Menorrhagia, unresponsive to conservative therapy.  POSTOPERATIVE DIAGNOSIS:  Menorrhagia, unresponsive to conservative therapy.  PROCEDURES:  Total vaginal hysterectomy, right salpingectomy.  ANESTHESIA:  General endotracheal anesthesia.  SURGEON:  Laverta Baltimore, MD.  FIRST ASSISTANT:  Vikki Ports Ward, MD  INDICATIONS:  A 28 year old gravida 3, para 3 patient with a long history of menorrhagia, failing conservative treatment with a Mirena IUD.  The patient elects for definitive therapy.  She is status post bilateral tubal ligation.  DESCRIPTION OF PROCEDURE:  After adequate general endotracheal anesthesia, the patient was placed in dorsal supine position, the legs in the candy cane stirrups.  The patient's lower abdomen, perineum and vagina were prepped and draped in normal sterile  fashion.  Timeout was performed.  The patient did receive 2 grams IV Ancef prior to commencement of the case.  Straight catheterization of the bladder yielded 200 mL of clear urine.  A weighted speculum was placed in the posterior vaginal vault and the  cervix was grasped with 2 thyroid tenaculae.  Cervix was then circumferentially injected with 1% lidocaine with 1:100,000 epinephrine.  A direct posterior colpotomy incision was made.  Upon entry into the posterior cul-de-sac, a  long billed weighted  speculum was placed.  Uterosacral ligaments were bilaterally clamped, transected, suture ligated and tagged for later identification.  Anterior cervix was incised with the Bovie, and the anterior cul-de-sac was entered sharply.  A Deaver retractor was  placed within to elevate the bladder anteriorly.  The cardinal ligaments were bilaterally clamped, transected,  suture ligated with 0 Vicryl suture.  The uterine arteries were then bilaterally clamped, transected, suture ligated with 0 Vicryl suture.  The  cornua were then clamped bilaterally, transected.  The uterus was delivered.  The pedicles were doubly ligated with 0 Vicryl suture.  An attempt to identify the fimbriated end of the left fallopian tube was unsuccessful due to presumed scarring.   Filshie clips were identified on both and proximal portion of fallopian tubes were removed.  The patient's right fallopian tube with the fimbriated end was identified and grasped with the Babcock clamp.  Heaney Ballantine clamp was placed across the  distal portion of the fallopian tube and this was removed sharply.  The pedicle was ligated with 0 Vicryl suture.  Ovaries appeared normal.  Good hemostasis was noted.  The peritoneum was closed in a pursestring fashion with 2-0 PDS suture.  The vaginal  vault was closed with a running 0 Vicryl suture.  Good hemostasis was noted.  The uterosacral ligaments were plicated centrally and the rest of the vaginal vault was closed.  Straight catheterization of the bladder again yielded a normal clear urine.  COMPLICATIONS:  There were no complications.  ESTIMATED BLOOD LOSS:  150 mL.  INTRAOPERATIVE FLUIDS:  800 mL.  URINE OUTPUT:  250 mL.    Patient did receive 30 mg IV Toradol at the end of the case.  The patient was taken to recovery room in good condition.  AN/NUANCE  D:09/04/2018 T:09/04/2018 JOB:[id number]

## 2018-09-04 NOTE — Transfer of Care (Signed)
Immediate Anesthesia Transfer of Care Note  Patient: Heather Ewing  Procedure(s) Performed: HYSTERECTOMY VAGINAL, Right Salpingectomy (Right )  Patient Location: PACU  Anesthesia Type:General  Level of Consciousness: awake  Airway & Oxygen Therapy: Patient Spontanous Breathing and Patient connected to face mask oxygen  Post-op Assessment: Report given to RN and Post -op Vital signs reviewed and stable  Post vital signs: Reviewed and stable  Last Vitals:  Vitals Value Taken Time  BP 146/85 09/04/18 1232  Temp    Pulse 88 09/04/18 1233  Resp 21 09/04/18 1233  SpO2 100 % 09/04/18 1233  Vitals shown include unvalidated device data.  Last Pain:  Vitals:   09/04/18 0921  TempSrc: Tympanic  PainSc: 0-No pain         Complications: No apparent anesthesia complications

## 2018-09-04 NOTE — Anesthesia Postprocedure Evaluation (Signed)
Anesthesia Post Note  Patient: ANSHI JALLOH  Procedure(s) Performed: HYSTERECTOMY VAGINAL, Right Salpingectomy (Right )  Patient location during evaluation: PACU Anesthesia Type: General Level of consciousness: awake and alert Pain management: pain level controlled Vital Signs Assessment: post-procedure vital signs reviewed and stable Respiratory status: spontaneous breathing, nonlabored ventilation, respiratory function stable and patient connected to nasal cannula oxygen Cardiovascular status: blood pressure returned to baseline and stable Postop Assessment: no apparent nausea or vomiting Anesthetic complications: no     Last Vitals:  Vitals:   09/04/18 1341 09/04/18 1400  BP: 136/89 (!) 149/85  Pulse: 80 85  Resp: 18 16  Temp: (!) 36.2 C   SpO2: 98% 97%    Last Pain:  Vitals:   09/04/18 1400  TempSrc:   PainSc: 3                  Gracynn Rajewski S

## 2018-09-04 NOTE — Anesthesia Post-op Follow-up Note (Signed)
Anesthesia QCDR form completed.        

## 2018-09-04 NOTE — Discharge Instructions (Addendum)
Vaginal Hysterectomy, Care After °Refer to this sheet in the next few weeks. These instructions provide you with information about caring for yourself after your procedure. Your health care provider may also give you more specific instructions. Your treatment has been planned according to current medical practices, but problems sometimes occur. Call your health care provider if you have any problems or questions after your procedure. °What can I expect after the procedure? °After the procedure, it is common to have: °· Pain. °· Soreness and numbness in your incision areas. °· Vaginal bleeding and discharge. °· Constipation. °· Temporary problems emptying the bladder. °· Feelings of sadness or other emotions. °Follow these instructions at home: °Medicines °· Take over-the-counter and prescription medicines only as told by your health care provider. °· If you were prescribed an antibiotic medicine, take it as told by your health care provider. Do not stop taking the antibiotic even if you start to feel better. °· Do not drive or operate heavy machinery while taking prescription pain medicine. °Activity °· Return to your normal activities as told by your health care provider. Ask your health care provider what activities are safe for you. °· Get regular exercise as told by your health care provider. You may be told to take short walks every day and go farther each time. °· Do not lift anything that is heavier than 10 lb (4.5 kg). °General instructions ° °· Do not put anything in your vagina for 6 weeks after your surgery or as told by your health care provider. This includes tampons and douches. °· Do not have sex until your health care provider says you can. °· Do not take baths, swim, or use a hot tub until your health care provider approves. °· Drink enough fluid to keep your urine clear or pale yellow. °· Do not drive for 24 hours if you were given a sedative. °· Keep all follow-up visits as told by your health  care provider. This is important. °Contact a health care provider if: °· Your pain medicine is not helping. °· You have a fever. °· You have redness, swelling, or pain at your incision site. °· You have blood, pus, or a bad-smelling discharge from your vagina. °· You continue to have difficulty urinating. °Get help right away if: °· You have severe abdominal or back pain. °· You have heavy bleeding from your vagina. °· You have chest pain or shortness of breath. °This information is not intended to replace advice given to you by your health care provider. Make sure you discuss any questions you have with your health care provider. °Document Released: 05/19/2015 Document Revised: 09/18/2015 Document Reviewed: 02/09/2015 °Elsevier Patient Education © 2020 [employer] ° ° °AMBULATORY SURGERY  °DISCHARGE INSTRUCTIONS ° ° °1) The drugs that you were given will stay in your system until tomorrow so for the next 24 hours you should not: ° °A) Drive an automobile °B) Make any legal decisions °C) Drink any alcoholic beverage ° ° °2) You may resume regular meals tomorrow.  Today it is better to start with liquids and gradually work up to solid foods. ° °You may eat anything you prefer, but it is better to start with liquids, then soup and crackers, and gradually work up to solid foods. ° ° °3) Please notify your doctor immediately if you have any unusual bleeding, trouble breathing, redness and pain at the surgery site, drainage, fever, or pain not relieved by medication. ° ° ° °4) Additional Instructions: ° ° ° ° ° ° ° °  Please contact your physician with any problems or Same Day Surgery at [phone], Monday through Friday 6 am to 4 pm, or [place] [facility] [place] Main number at [phone]. °

## 2018-09-04 NOTE — Anesthesia Preprocedure Evaluation (Signed)
Anesthesia Evaluation  Patient identified by MRN, date of birth, ID band Patient awake    Reviewed: Allergy & Precautions, NPO status , Patient's Chart, lab work & pertinent test results, reviewed documented beta blocker date and time   Airway Mallampati: III  TM Distance: >3 FB     Dental  (+) Chipped   Pulmonary asthma , sleep apnea , Current Smoker,           Cardiovascular      Neuro/Psych    GI/Hepatic   Endo/Other  diabetes, Type 2Morbid obesity  Renal/GU      Musculoskeletal   Abdominal   Peds  Hematology   Anesthesia Other Findings   Reproductive/Obstetrics                             Anesthesia Physical Anesthesia Plan  ASA: III  Anesthesia Plan: General   Post-op Pain Management:    Induction: Intravenous  PONV Risk Score and Plan:   Airway Management Planned: Oral ETT  Additional Equipment:   Intra-op Plan:   Post-operative Plan:   Informed Consent: I have reviewed the patients History and Physical, chart, labs and discussed the procedure including the risks, benefits and alternatives for the proposed anesthesia with the patient or authorized representative who has indicated his/her understanding and acceptance.       Plan Discussed with: CRNA  Anesthesia Plan Comments:         Anesthesia Quick Evaluation

## 2018-09-04 NOTE — Anesthesia Procedure Notes (Signed)
Procedure Name: Intubation Date/Time: 09/04/2018 10:50 AM Performed by: Philbert Riser, CRNA Pre-anesthesia Checklist: Patient identified, Emergency Drugs available, Suction available, Patient being monitored and Timeout performed Patient Re-evaluated:Patient Re-evaluated prior to induction Oxygen Delivery Method: Circle system utilized and Simple face mask Preoxygenation: Pre-oxygenation with 100% oxygen Induction Type: IV induction Ventilation: Mask ventilation without difficulty Laryngoscope Size: Mac and 3 Grade View: Grade II Tube type: Oral Tube size: 7.0 mm Number of attempts: 1 Airway Equipment and Method: Stylet Placement Confirmation: ETT inserted through vocal cords under direct vision,  positive ETCO2 and breath sounds checked- equal and bilateral Secured at: 21 cm Tube secured with: Tape Dental Injury: Teeth and Oropharynx as per pre-operative assessment

## 2018-09-04 NOTE — Brief Op Note (Signed)
09/04/2018  12:21 PM  PATIENT:  Heather Ewing  28 y.o. female  PRE-OPERATIVE DIAGNOSIS:  Menorrhagia  POST-OPERATIVE DIAGNOSIS:  Menorrhagia  PROCEDURE:Total vaginal hysterectomy  And right salpingectomy  SURGEON:  Surgeon(s) and Role:    * Schermerhorn, Gwen Her, MD - Primary    * Ward, Honor Loh, MD - Assisting  PHYSICIAN ASSISTANT:   ASSISTANTS: scrub tech    ANESTHESIA:   general  EBL:  150 mL   BLOOD ADMINISTERED:none  DRAINS: none   LOCAL MEDICATIONS USED:  Lidocaine with epi     SPECIMEN:  Source of Specimen:  cx and uterus and right fallopian tube   DISPOSITION OF SPECIMEN:  PATHOLOGY  COUNTS:  YES  TOURNIQUET:  * No tourniquets in log *  DICTATION: .Other Dictation: Dictation Number verbal  PLAN OF CARE: Discharge to home after PACU  PATIENT DISPOSITION:  PACU - hemodynamically stable.   Delay start of Pharmacological VTE agent (>24hrs) due to surgical blood loss or risk of bleeding: not applicable

## 2018-09-07 LAB — SURGICAL PATHOLOGY

## 2019-01-25 ENCOUNTER — Emergency Department: Payer: 59

## 2019-01-25 ENCOUNTER — Other Ambulatory Visit: Payer: Self-pay

## 2019-01-25 ENCOUNTER — Emergency Department
Admission: EM | Admit: 2019-01-25 | Discharge: 2019-01-25 | Disposition: A | Discharge status: Home / Self Care | Payer: 59 | Attending: Emergency Medicine | Admitting: Emergency Medicine

## 2019-01-25 ENCOUNTER — Encounter: Payer: Self-pay | Admitting: Emergency Medicine

## 2019-01-25 DIAGNOSIS — Z20828 Contact with and (suspected) exposure to other viral communicable diseases: Secondary | ICD-10-CM | POA: Insufficient documentation

## 2019-01-25 DIAGNOSIS — J988 Other specified respiratory disorders: Secondary | ICD-10-CM | POA: Diagnosis not present

## 2019-01-25 DIAGNOSIS — E119 Type 2 diabetes mellitus without complications: Secondary | ICD-10-CM | POA: Diagnosis not present

## 2019-01-25 DIAGNOSIS — B9789 Other viral agents as the cause of diseases classified elsewhere: Secondary | ICD-10-CM | POA: Diagnosis not present

## 2019-01-25 DIAGNOSIS — F17210 Nicotine dependence, cigarettes, uncomplicated: Secondary | ICD-10-CM | POA: Diagnosis not present

## 2019-01-25 DIAGNOSIS — Z7984 Long term (current) use of oral hypoglycemic drugs: Secondary | ICD-10-CM | POA: Diagnosis not present

## 2019-01-25 DIAGNOSIS — R0602 Shortness of breath: Secondary | ICD-10-CM | POA: Diagnosis present

## 2019-01-25 DIAGNOSIS — J45909 Unspecified asthma, uncomplicated: Secondary | ICD-10-CM | POA: Insufficient documentation

## 2019-01-25 DIAGNOSIS — Z79899 Other long term (current) drug therapy: Secondary | ICD-10-CM | POA: Diagnosis not present

## 2019-01-25 LAB — CBC
HCT: 39.9 % (ref 36.0–46.0)
Hemoglobin: 13.3 g/dL (ref 12.0–15.0)
MCH: 29.2 pg (ref 26.0–34.0)
MCHC: 33.3 g/dL (ref 30.0–36.0)
MCV: 87.7 fL (ref 80.0–100.0)
Platelets: 241 10*3/uL (ref 150–400)
RBC: 4.55 MIL/uL (ref 3.87–5.11)
RDW: 13 % (ref 11.5–15.5)
WBC: 9.7 10*3/uL (ref 4.0–10.5)
nRBC: 0 % (ref 0.0–0.2)

## 2019-01-25 LAB — BASIC METABOLIC PANEL
Anion gap: 12 (ref 5–15)
BUN: 11 mg/dL (ref 6–20)
CO2: 24 mmol/L (ref 22–32)
Calcium: 9.2 mg/dL (ref 8.9–10.3)
Chloride: 101 mmol/L (ref 98–111)
Creatinine, Ser: 0.63 mg/dL (ref 0.44–1.00)
GFR calc Af Amer: >60 mL/min (ref >60)
GFR calc non Af Amer: >60 mL/min (ref >60)
Glucose, Bld: 141 mg/dL — ABNORMAL HIGH (ref 70–99)
Potassium: 3.3 mmol/L — ABNORMAL LOW (ref 3.5–5.1)
Sodium: 137 mmol/L (ref 135–145)

## 2019-01-25 LAB — POC SARS CORONAVIRUS 2 AG: SARS Coronavirus 2 Ag: NEGATIVE

## 2019-01-25 LAB — RESPIRATORY PANEL BY RT PCR (FLU A&B, COVID)
Influenza A by PCR: NEGATIVE
Influenza B by PCR: NEGATIVE
SARS Coronavirus 2 by RT PCR: NEGATIVE

## 2019-01-25 LAB — TROPONIN I (HIGH SENSITIVITY): Troponin I (High Sensitivity): 4 ng/L (ref <18)

## 2019-01-25 MED ORDER — AZITHROMYCIN 500 MG PO TABS: 500.0000 mg | ORAL_TABLET | Freq: Every day | ORAL | 0 refills | Status: AC

## 2019-01-25 MED ORDER — ALBUTEROL SULFATE HFA 108 (90 BASE) MCG/ACT IN AERS: 2.0000 | INHALATION_SPRAY | Freq: Four times a day (QID) | RESPIRATORY_TRACT | 0 refills | Status: DC | PRN

## 2019-01-25 MED ORDER — BENZONATATE 100 MG PO CAPS: 100.0000 mg | ORAL_CAPSULE | Freq: Three times a day (TID) | ORAL | 0 refills | Status: AC | PRN

## 2019-01-25 MED ORDER — GUAIFENESIN ER 600 MG PO TB12: 600.0000 mg | ORAL_TABLET | Freq: Two times a day (BID) | ORAL | 0 refills | Status: AC | PRN

## 2019-01-25 NOTE — ED Triage Notes (Signed)
Pt reports chest congestion that started today at work and tonight she has had increased SOB, general malaise and HA. Pt scheduled through work for COVID test in the AM.

## 2019-01-25 NOTE — ED Provider Notes (Signed)
[facility] Emergency Department Provider Note __________   First MD Initiated Contact with Patient 01/25/19 [id number]     (approximate)  I have reviewed the triage vital signs and the nursing notes.   HISTORY  Chief Complaint Shortness of Breath   HPI Heather Ewing is a 28 y.o. female with below list of previous medical conditions including diabetes and asthma presents to the emergency department secondary to a 1 day history of dyspnea and nonproductive cough generalized weakness and malaise nasal congestion and headache.  Patient admits to having contact with people were Covid positive.  Patient denies any diarrhea or vomiting.  Patient denies any loss of taste or smell.  Patient does admit to quarter pack of cigarette use per day        Past Medical History:  Diagnosis Date  . Asthma   . Diabetes mellitus without complication (HCC) 2017  . Family history of breast cancer    7/19 Qualifies for cancer genetic testing with NCCN but not [employer] insurance  . Obesity   . Polycystic ovaries   . Sleep apnea     Patient Active Problem List   Diagnosis Date Noted  . Postoperative state 09/04/2018  . Women's annual routine gynecological examination 04/07/2016  . History of abnormal cervical Pap smear 04/07/2016    Past Surgical History:  Procedure Laterality Date  . CHOLECYSTECTOMY  07/2013  . TUBAL LIGATION  10/2012  . VAGINAL HYSTERECTOMY Right 09/04/2018   Procedure: HYSTERECTOMY VAGINAL, Right Salpingectomy;  Surgeon: Schermerhorn, Ihor Austinhomas J, MD;  Location: [facility] ORS;  Service: Gynecology;  Laterality: Right;    Prior to Admission medications   Medication Sig Start Date End Date Taking? Authorizing Provider  acetaminophen (TYLENOL) 500 MG tablet Take 1,000 mg by mouth every 6 (six) hours as needed for moderate pain.     [provider]  albuterol (PROVENTIL HFA;VENTOLIN HFA) 108 (90 Base) MCG/ACT inhaler Inhale 1-2 puffs into the lungs every  6 (six) hours as needed for wheezing or shortness of breath.    [provider]  cetirizine (ZYRTEC) 10 MG tablet Take 10 mg by mouth daily.  04/05/16   [provider]  Fluticasone-Salmeterol (ADVAIR) 250-50 MCG/DOSE AEPB Inhale 1 puff into the lungs 2 (two) times daily.    [provider]  lisdexamfetamine (VYVANSE) 60 MG capsule Take 60 mg by mouth every morning.    [provider]  metFORMIN (GLUCOPHAGE-XR) 500 MG 24 hr tablet Take 1,000 mg by mouth at bedtime.  07/07/17   [provider]  montelukast (SINGULAIR) 10 MG tablet Take 10 mg by mouth at bedtime.  02/16/17 09/04/18  [provider]  ondansetron (ZOFRAN) 8 MG tablet Take 8 mg by mouth every 8 (eight) hours as needed for nausea or vomiting.  01/15/16   [provider]    Allergies Iodinated diagnostic agents and Shellfish allergy  Family History  Problem Relation Age of Onset  . Congestive Heart Failure Mother   . Diabetes Mother   . Hypertension Mother   . Skin cancer Mother   . Congestive Heart Failure Father   . Diabetes Father   . Hypertension Father   . Breast cancer Paternal Aunt [id number]  . Stomach cancer Paternal Grandfather [id number]    Social History Social History   Tobacco Use  . Smoking status: Current Every Day Smoker    Packs/day: 0.50    Types: Cigarettes  . Smokeless tobacco: Never Used  . Tobacco comment: quit 10/2015  Substance Use Topics  . Alcohol use: Yes    Comment: occ  . Drug use: No    Review of Systems Constitutional: No fever/chills Eyes: No visual changes. ENT: No sore throat. Cardiovascular: Denies chest pain. Respiratory: Positive for dyspnea and cough Gastrointestinal: No abdominal pain.  No nausea, no vomiting.  No diarrhea.  No constipation. Genitourinary: Negative for dysuria. Musculoskeletal: Negative for neck pain.  Negative for back pain. Integumentary: Negative for rash. Neurological: Negative for headaches, focal weakness  or numbness.  ____________________________________________   PHYSICAL EXAM:  VITAL SIGNS: ED Triage Vitals [01/25/19 0112]  Enc Vitals Group     BP 134/83     Pulse Rate 93     Resp 18     Temp 97.9 F (36.6 C)     Temp Source Oral     SpO2 97 %     Weight      Height      Head Circumference      Peak Flow      Pain Score      Pain Loc      Pain Edu?      Excl. in [place]?     Constitutional: Alert and oriented.  Eyes: Conjunctivae are normal.  Mouth/Throat: Patient is wearing a mask. Neck: No stridor.  No meningeal signs.   Cardiovascular: Normal rate, regular rhythm. Good peripheral circulation. Grossly normal heart sounds. Respiratory: Normal respiratory effort.  No retractions. Gastrointestinal: Soft and nontender. No distention.  Musculoskeletal: No lower extremity tenderness nor edema. No gross deformities of extremities. Neurologic:  Normal speech and language. No gross focal neurologic deficits are appreciated.  Skin:  Skin is warm, dry and intact. Psychiatric: Mood and affect are normal. Speech and behavior are normal.  ____________________________________________   LABS (all labs ordered are listed, but only abnormal results are displayed)  Labs Reviewed  BASIC METABOLIC PANEL - Abnormal; Notable for the following components:      Result Value   Potassium 3.3 (*)    Glucose, Bld 141 (*)    All other components within normal limits  RESPIRATORY PANEL BY RT PCR (FLU A&B, COVID)  CBC  POC SARS CORONAVIRUS 2 AG -  ED  POC SARS CORONAVIRUS 2 AG  POC URINE PREG, ED  TROPONIN I (HIGH SENSITIVITY)  TROPONIN I (HIGH SENSITIVITY)   ____________________________________________  EKG  ED ECG REPORT I, [place] N Devonta Blanford, the attending physician, personally viewed and interpreted this ECG.   Date: 01/25/2019  EKG Time: 1:13 AM  Rate: 95  Rhythm: Normal sinus rhythm  Axis: Normal  Intervals: Normal  ST&T Change: None   ____________________________________________  RADIOLOGY I, [place] N Elky Funches, personally viewed and evaluated these images (plain radiographs) as part of my medical decision making, as well as reviewing the written report by the radiologist.  ED MD interpretation: No active cardiopulmonary disease noted on chest x-ray.  Official radiology report(s): DG Chest 2 View  Result Date: 01/25/2019 CLINICAL DATA:  Congestion EXAM: CHEST - 2 VIEW COMPARISON:  None. FINDINGS: The heart size and mediastinal contours are within normal limits. Both lungs are clear. The visualized skeletal structures are unremarkable. IMPRESSION: No active cardiopulmonary disease. Electronically Signed   By: Constance Holster M.D.   On: 01/25/2019 01:36     Procedures   ____________________________________________   INITIAL IMPRESSION / MDM / ASSESSMENT AND PLAN / ED COURSE  As part of my medical decision making, I reviewed the following data within the [place]  28 year old female presented with above-stated history and physical exam with differential diagnosis including but not limited to bronchitis pneumonia influenza COVID-19.  COVID-19 testing negative influenza likewise negative.  Chest x-ray revealed no evidence of pneumonia.  Patient will be prescribed azithromycin albuterol and Tessalon for home. ____________________________________________  FINAL CLINICAL IMPRESSION(S) / ED DIAGNOSES  Final diagnoses:  Viral respiratory illness     MEDICATIONS GIVEN DURING THIS VISIT:  Medications - No data to display   ED Discharge Orders    None      *Please note:  JACULIN RASMUS was evaluated in Emergency Department on 01/25/2019 for the symptoms described in the history of present illness. She was evaluated in the context of the global COVID-19 pandemic, which necessitated consideration that the patient might be at risk for infection with the SARS-CoV-2 virus that causes COVID-19.  Institutional protocols and algorithms that pertain to the evaluation of patients at risk for COVID-19 are in a state of rapid change based on information released by regulatory bodies including the CDC and federal and state organizations. These policies and algorithms were followed during the patient's care in the ED.  Some ED evaluations and interventions may be delayed as a result of limited staffing during the pandemic.*  Note:  This document was prepared using Dragon voice recognition software and may include unintentional dictation errors.   Darci Current, MD 01/25/19 [phone]

## 2019-01-25 NOTE — ED Notes (Signed)
Pt in with co shob and chest tightness since yesterday. States started with scratchy throat and chest tightness. Did use inhaler she has at home without relief. States she developed a cough, no fever. Pt has apptm this am for Covid test, works at [employer].

## 2020-09-26 IMAGING — CR DG CHEST 2V
1 series · 2 of 2 positions shown · non-contrast
Comparison: None.

CLINICAL DATA: Congestion

EXAM:
CHEST - 2 VIEW

[Series 1: w chest pa · 0.14mm/px · 2 of 2 slices shown]
[im 1/2]
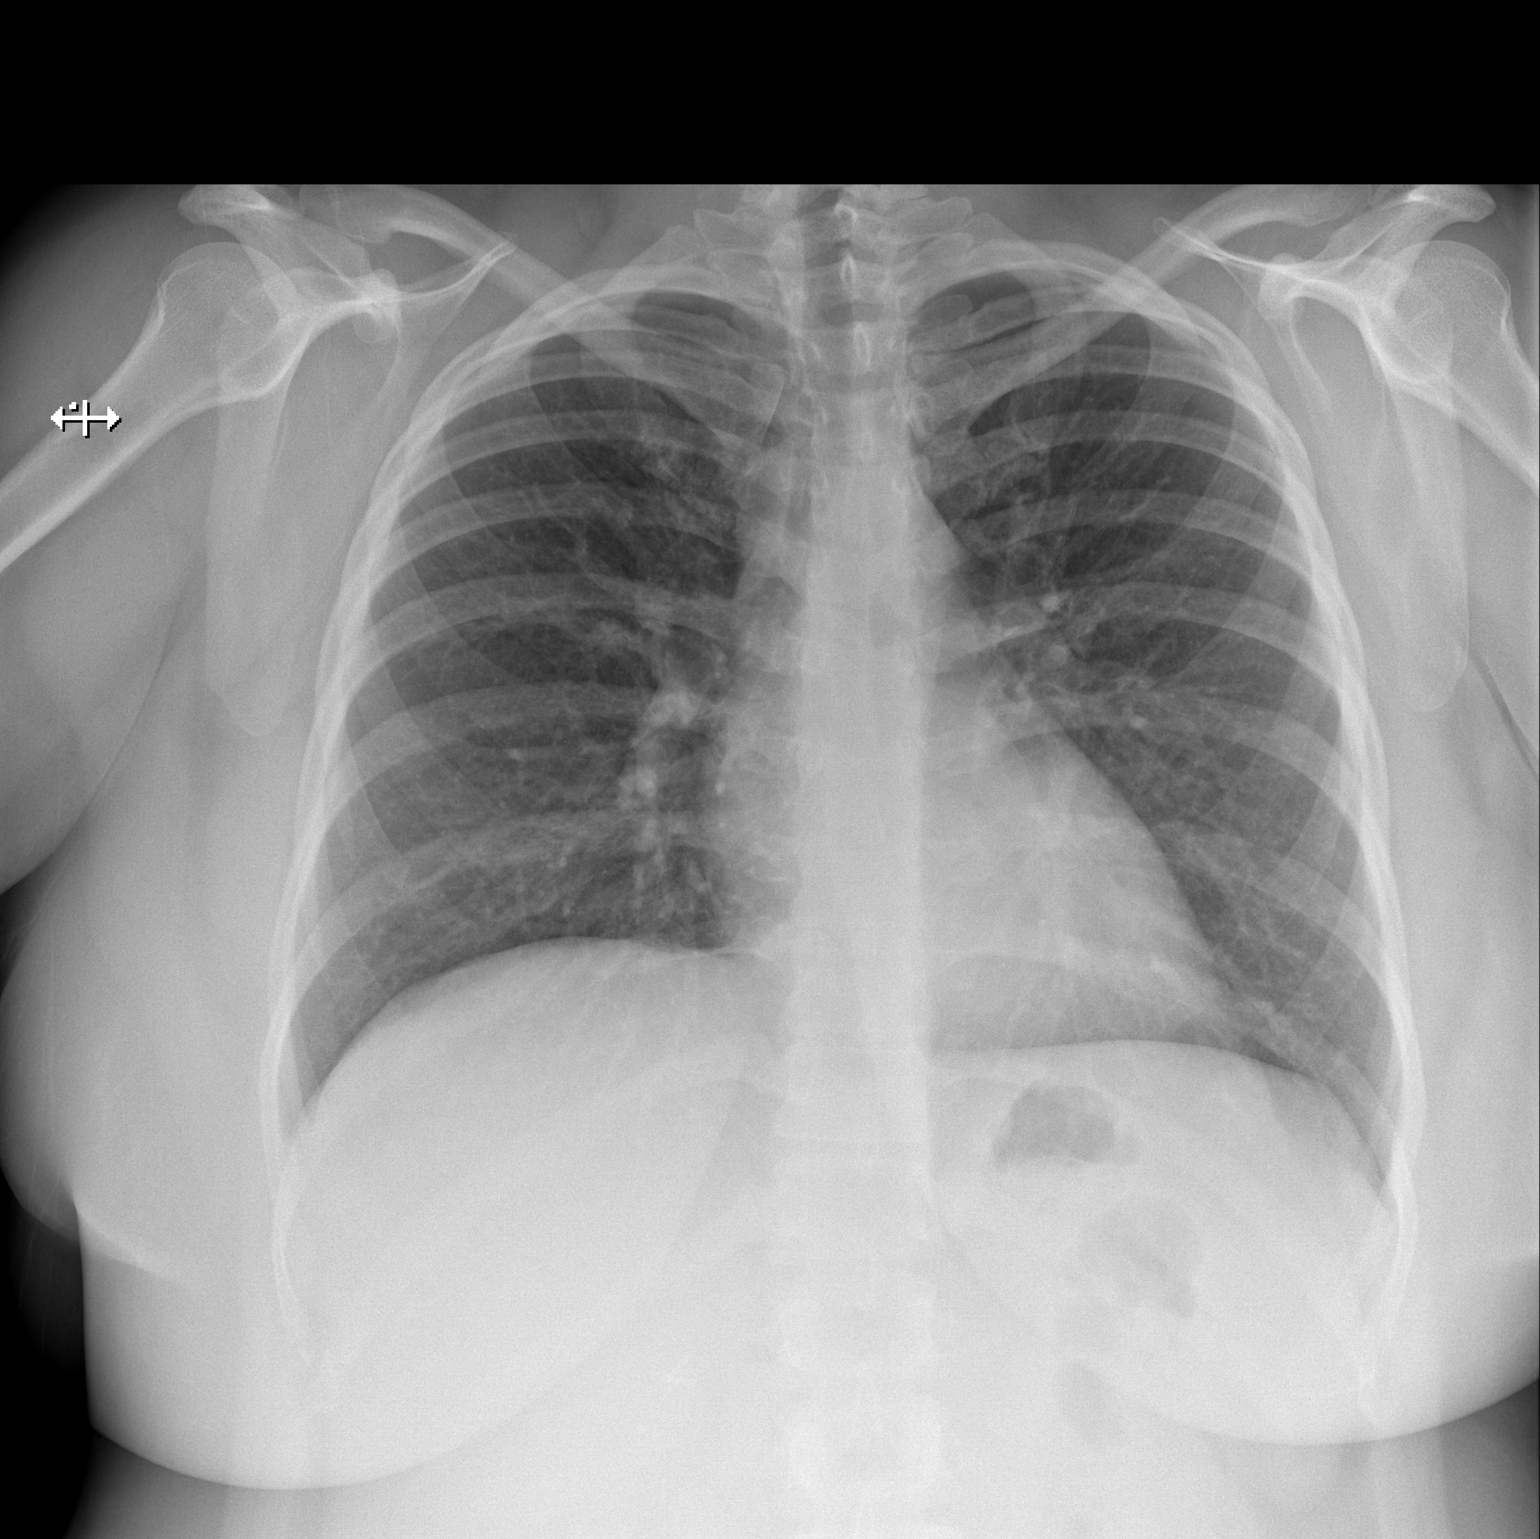
[im 2/2]
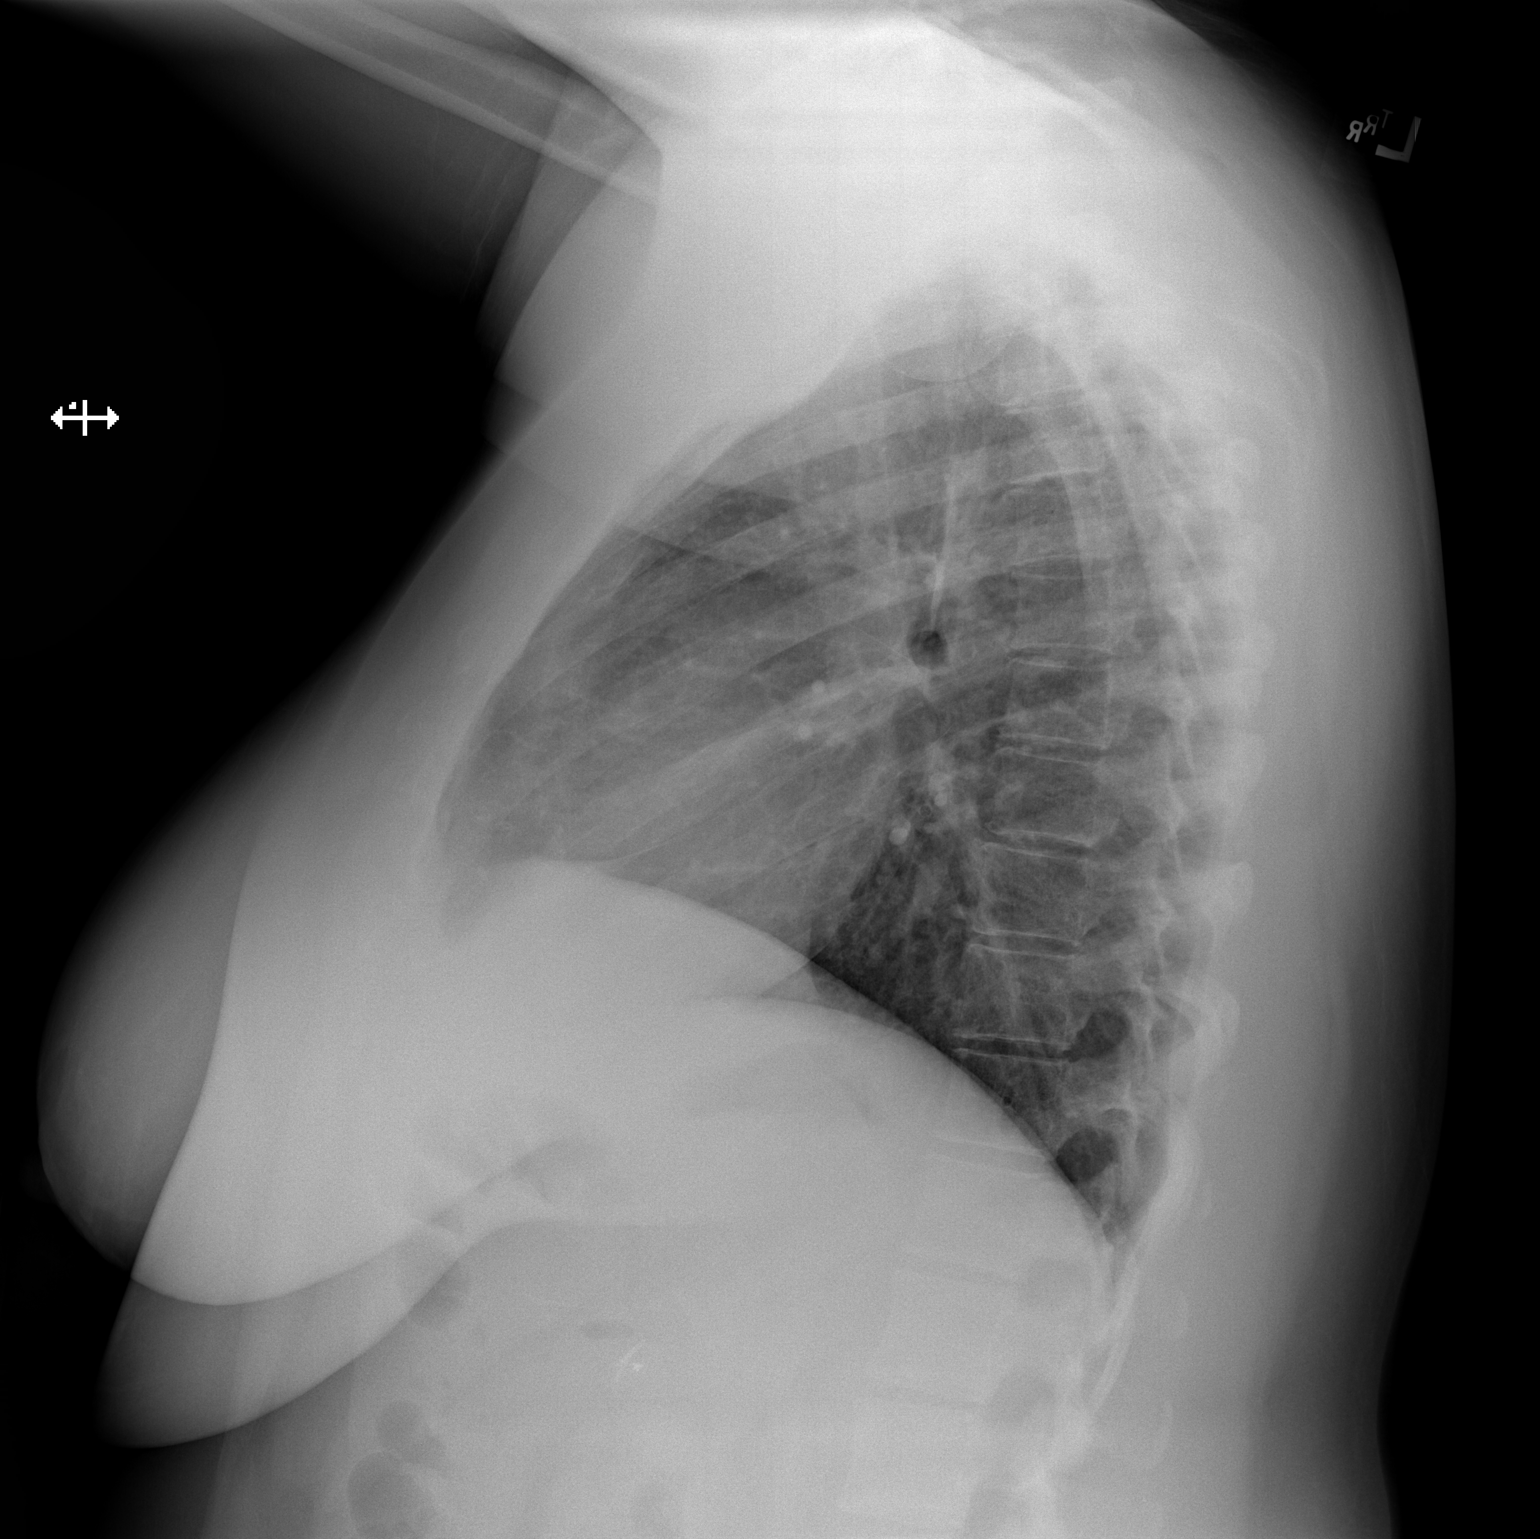

[2 of 2 positions shown; findings below may reference images not displayed]

FINDINGS: The heart size and mediastinal contours are within normal limits.
Both lungs are clear. The visualized skeletal structures are
unremarkable.
IMPRESSION: No active cardiopulmonary disease.

## 2021-06-12 DIAGNOSIS — R69 Illness, unspecified: Secondary | ICD-10-CM | POA: Diagnosis not present

## 2021-07-08 DIAGNOSIS — M25531 Pain in right wrist: Secondary | ICD-10-CM | POA: Diagnosis not present

## 2021-07-22 DIAGNOSIS — M25531 Pain in right wrist: Secondary | ICD-10-CM | POA: Diagnosis not present

## 2021-07-24 DIAGNOSIS — E782 Mixed hyperlipidemia: Secondary | ICD-10-CM | POA: Diagnosis not present

## 2021-07-24 DIAGNOSIS — R7989 Other specified abnormal findings of blood chemistry: Secondary | ICD-10-CM | POA: Diagnosis not present

## 2021-07-24 DIAGNOSIS — E11649 Type 2 diabetes mellitus with hypoglycemia without coma: Secondary | ICD-10-CM | POA: Diagnosis not present

## 2021-07-29 DIAGNOSIS — E782 Mixed hyperlipidemia: Secondary | ICD-10-CM | POA: Diagnosis not present

## 2021-07-29 DIAGNOSIS — J4540 Moderate persistent asthma, uncomplicated: Secondary | ICD-10-CM | POA: Diagnosis not present

## 2021-07-29 DIAGNOSIS — N898 Other specified noninflammatory disorders of vagina: Secondary | ICD-10-CM | POA: Diagnosis not present

## 2021-07-29 DIAGNOSIS — E1142 Type 2 diabetes mellitus with diabetic polyneuropathy: Secondary | ICD-10-CM | POA: Diagnosis not present

## 2021-07-29 DIAGNOSIS — R7989 Other specified abnormal findings of blood chemistry: Secondary | ICD-10-CM | POA: Diagnosis not present

## 2021-07-29 DIAGNOSIS — B3731 Acute candidiasis of vulva and vagina: Secondary | ICD-10-CM | POA: Diagnosis not present

## 2021-07-29 DIAGNOSIS — N760 Acute vaginitis: Secondary | ICD-10-CM | POA: Diagnosis not present

## 2021-08-07 DIAGNOSIS — Z79899 Other long term (current) drug therapy: Secondary | ICD-10-CM | POA: Diagnosis not present

## 2021-08-07 DIAGNOSIS — F331 Major depressive disorder, recurrent, moderate: Secondary | ICD-10-CM | POA: Diagnosis not present

## 2021-08-07 DIAGNOSIS — F411 Generalized anxiety disorder: Secondary | ICD-10-CM | POA: Diagnosis not present

## 2021-08-07 DIAGNOSIS — R69 Illness, unspecified: Secondary | ICD-10-CM | POA: Diagnosis not present

## 2021-09-18 DIAGNOSIS — Z79899 Other long term (current) drug therapy: Secondary | ICD-10-CM | POA: Diagnosis not present

## 2021-09-18 DIAGNOSIS — F411 Generalized anxiety disorder: Secondary | ICD-10-CM | POA: Diagnosis not present

## 2021-09-18 DIAGNOSIS — R69 Illness, unspecified: Secondary | ICD-10-CM | POA: Diagnosis not present

## 2021-09-18 DIAGNOSIS — F331 Major depressive disorder, recurrent, moderate: Secondary | ICD-10-CM | POA: Diagnosis not present

## 2021-10-09 DIAGNOSIS — Z79899 Other long term (current) drug therapy: Secondary | ICD-10-CM | POA: Diagnosis not present

## 2021-10-09 DIAGNOSIS — F331 Major depressive disorder, recurrent, moderate: Secondary | ICD-10-CM | POA: Diagnosis not present

## 2021-10-09 DIAGNOSIS — R69 Illness, unspecified: Secondary | ICD-10-CM | POA: Diagnosis not present

## 2021-10-09 DIAGNOSIS — F411 Generalized anxiety disorder: Secondary | ICD-10-CM | POA: Diagnosis not present

## 2021-10-23 DIAGNOSIS — Z794 Long term (current) use of insulin: Secondary | ICD-10-CM | POA: Diagnosis not present

## 2021-10-23 DIAGNOSIS — R1013 Epigastric pain: Secondary | ICD-10-CM | POA: Diagnosis not present

## 2021-10-23 DIAGNOSIS — Z79899 Other long term (current) drug therapy: Secondary | ICD-10-CM | POA: Diagnosis not present

## 2021-10-23 DIAGNOSIS — R079 Chest pain, unspecified: Secondary | ICD-10-CM | POA: Diagnosis not present

## 2021-10-23 DIAGNOSIS — R69 Illness, unspecified: Secondary | ICD-10-CM | POA: Diagnosis not present

## 2021-10-23 DIAGNOSIS — R0789 Other chest pain: Secondary | ICD-10-CM | POA: Diagnosis not present

## 2021-10-23 DIAGNOSIS — M940 Chondrocostal junction syndrome [Tietze]: Secondary | ICD-10-CM | POA: Diagnosis not present

## 2021-10-23 DIAGNOSIS — Z91013 Allergy to seafood: Secondary | ICD-10-CM | POA: Diagnosis not present

## 2021-10-23 DIAGNOSIS — Z91041 Radiographic dye allergy status: Secondary | ICD-10-CM | POA: Diagnosis not present

## 2023-07-06 ENCOUNTER — Emergency Department (HOSPITAL_BASED_OUTPATIENT_CLINIC_OR_DEPARTMENT_OTHER)
Admission: EM | Admit: 2023-07-06 | Discharge: 2023-07-06 | Disposition: A | Discharge status: Home / Self Care | Attending: Emergency Medicine | Admitting: Emergency Medicine

## 2023-07-06 ENCOUNTER — Encounter (HOSPITAL_BASED_OUTPATIENT_CLINIC_OR_DEPARTMENT_OTHER): Payer: Self-pay | Admitting: Emergency Medicine

## 2023-07-06 ENCOUNTER — Emergency Department (HOSPITAL_BASED_OUTPATIENT_CLINIC_OR_DEPARTMENT_OTHER)

## 2023-07-06 ENCOUNTER — Other Ambulatory Visit: Payer: Self-pay

## 2023-07-06 DIAGNOSIS — R1084 Generalized abdominal pain: Secondary | ICD-10-CM

## 2023-07-06 DIAGNOSIS — E119 Type 2 diabetes mellitus without complications: Secondary | ICD-10-CM | POA: Diagnosis not present

## 2023-07-06 DIAGNOSIS — K529 Noninfective gastroenteritis and colitis, unspecified: Secondary | ICD-10-CM | POA: Diagnosis not present

## 2023-07-06 DIAGNOSIS — Z7984 Long term (current) use of oral hypoglycemic drugs: Secondary | ICD-10-CM | POA: Insufficient documentation

## 2023-07-06 DIAGNOSIS — R1011 Right upper quadrant pain: Secondary | ICD-10-CM | POA: Diagnosis present

## 2023-07-06 DIAGNOSIS — R718 Other abnormality of red blood cells: Secondary | ICD-10-CM | POA: Diagnosis not present

## 2023-07-06 DIAGNOSIS — D72829 Elevated white blood cell count, unspecified: Secondary | ICD-10-CM | POA: Insufficient documentation

## 2023-07-06 LAB — URINALYSIS, ROUTINE W REFLEX MICROSCOPIC
Bilirubin Urine: NEGATIVE
Glucose, UA: NEGATIVE mg/dL
Hgb urine dipstick: NEGATIVE
Ketones, ur: NEGATIVE mg/dL
Leukocytes,Ua: NEGATIVE
Nitrite: NEGATIVE
Specific Gravity, Urine: 1.024 (ref 1.005–1.030)
pH: 5.5 (ref 5.0–8.0)

## 2023-07-06 LAB — COMPREHENSIVE METABOLIC PANEL WITH GFR
ALT: 54 U/L — ABNORMAL HIGH (ref 0–44)
AST: 38 U/L (ref 15–41)
Albumin: 4.8 g/dL (ref 3.5–5.0)
Alkaline Phosphatase: 73 U/L (ref 38–126)
Anion gap: 17 — ABNORMAL HIGH (ref 5–15)
BUN: 11 mg/dL (ref 6–20)
CO2: 20 mmol/L — ABNORMAL LOW (ref 22–32)
Calcium: 10.4 mg/dL — ABNORMAL HIGH (ref 8.9–10.3)
Chloride: 100 mmol/L (ref 98–111)
Creatinine, Ser: 0.69 mg/dL (ref 0.44–1.00)
GFR, Estimated: >60 mL/min (ref >60)
Glucose, Bld: 94 mg/dL (ref 70–99)
Potassium: 4.4 mmol/L (ref 3.5–5.1)
Sodium: 137 mmol/L (ref 135–145)
Total Bilirubin: 0.5 mg/dL (ref 0.0–1.2)
Total Protein: 8 g/dL (ref 6.5–8.1)

## 2023-07-06 LAB — CBC WITH DIFFERENTIAL/PLATELET
Abs Immature Granulocytes: 0.05 10*3/uL (ref 0.00–0.07)
Basophils Absolute: 0 10*3/uL (ref 0.0–0.1)
Basophils Relative: 0 %
Eosinophils Absolute: 0.1 10*3/uL (ref 0.0–0.5)
Eosinophils Relative: 1 %
HCT: 45.2 % (ref 36.0–46.0)
Hemoglobin: 15.7 g/dL — ABNORMAL HIGH (ref 12.0–15.0)
Immature Granulocytes: 0 %
Lymphocytes Relative: 16 %
Lymphs Abs: 2.3 10*3/uL (ref 0.7–4.0)
MCH: 32.1 pg (ref 26.0–34.0)
MCHC: 34.7 g/dL (ref 30.0–36.0)
MCV: 92.4 fL (ref 80.0–100.0)
Monocytes Absolute: 0.8 10*3/uL (ref 0.1–1.0)
Monocytes Relative: 6 %
Neutro Abs: 10.9 10*3/uL — ABNORMAL HIGH (ref 1.7–7.7)
Neutrophils Relative %: 77 %
Platelets: 285 10*3/uL (ref 150–400)
RBC: 4.89 MIL/uL (ref 3.87–5.11)
RDW: 11.9 % (ref 11.5–15.5)
WBC: 14.3 10*3/uL — ABNORMAL HIGH (ref 4.0–10.5)
nRBC: 0 % (ref 0.0–0.2)

## 2023-07-06 LAB — BASIC METABOLIC PANEL WITH GFR
Anion gap: 13 (ref 5–15)
BUN: 10 mg/dL (ref 6–20)
CO2: 21 mmol/L — ABNORMAL LOW (ref 22–32)
Calcium: 9.5 mg/dL (ref 8.9–10.3)
Chloride: 103 mmol/L (ref 98–111)
Creatinine, Ser: 0.7 mg/dL (ref 0.44–1.00)
GFR, Estimated: >60 mL/min (ref >60)
Glucose, Bld: 91 mg/dL (ref 70–99)
Potassium: 4.1 mmol/L (ref 3.5–5.1)
Sodium: 137 mmol/L (ref 135–145)

## 2023-07-06 LAB — PREGNANCY, URINE: Preg Test, Ur: NEGATIVE

## 2023-07-06 LAB — LIPASE, BLOOD: Lipase: 43 U/L (ref 11–51)

## 2023-07-06 MED ORDER — FENTANYL CITRATE PF 50 MCG/ML IJ SOSY
50.0000 ug | PREFILLED_SYRINGE | Freq: Once | INTRAMUSCULAR | Status: AC
  Administered 2023-07-06: 50 ug via INTRAVENOUS
  Filled 2023-07-06: qty 1

## 2023-07-06 MED ORDER — OXYCODONE-ACETAMINOPHEN 5-325 MG PO TABS: 1.0000 | ORAL_TABLET | Freq: Four times a day (QID) | ORAL | 0 refills | Status: AC | PRN

## 2023-07-06 MED ORDER — FAMOTIDINE IN NACL 20-0.9 MG/50ML-% IV SOLN
20.0000 mg | Freq: Once | INTRAVENOUS | Status: AC
  Administered 2023-07-06: 20 mg via INTRAVENOUS
  Filled 2023-07-06 (×2): qty 50

## 2023-07-06 MED ORDER — AMOXICILLIN-POT CLAVULANATE 875-125 MG PO TABS: 1.0000 | ORAL_TABLET | Freq: Two times a day (BID) | ORAL | 0 refills | Status: DC

## 2023-07-06 MED ORDER — OXYCODONE-ACETAMINOPHEN 5-325 MG PO TABS
1.0000 | ORAL_TABLET | Freq: Once | ORAL | Status: AC
  Administered 2023-07-06: 1 via ORAL
  Filled 2023-07-06: qty 1

## 2023-07-06 MED ORDER — SODIUM CHLORIDE 0.9 % IV BOLUS
1000.0000 mL | Freq: Once | INTRAVENOUS | Status: AC
  Administered 2023-07-06: 1000 mL via INTRAVENOUS

## 2023-07-06 MED ORDER — ONDANSETRON HCL 4 MG/2ML IJ SOLN
4.0000 mg | Freq: Once | INTRAMUSCULAR | Status: AC
  Administered 2023-07-06: 4 mg via INTRAVENOUS
  Filled 2023-07-06: qty 2

## 2023-07-06 MED ORDER — LACTATED RINGERS IV BOLUS
1000.0000 mL | Freq: Once | INTRAVENOUS | Status: AC
  Administered 2023-07-06: 1000 mL via INTRAVENOUS

## 2023-07-06 MED ORDER — KETOROLAC TROMETHAMINE 15 MG/ML IJ SOLN: 15.0000 mg | Freq: Once | INTRAMUSCULAR | Status: DC

## 2023-07-06 MED ORDER — HYDROMORPHONE HCL 1 MG/ML IJ SOLN: 1.0000 mg | Freq: Once | INTRAMUSCULAR | Status: DC

## 2023-07-06 NOTE — ED Notes (Signed)
 Patient transported to CT

## 2023-07-06 NOTE — Discharge Instructions (Addendum)
 Your CT imaging revealed the following: IMPRESSION:  1. Mild terminal ileal wall thickening and surrounding mesenteric  edema, consistent with infectious ileitis versus inflammatory bowel  disease.  2. Normal appendix.  3. Trace pelvic free fluid, likely reactive.   We will discharge you on oral pain medicine and antibiotics. Please follow-up with your PCP. Return for any severe worsening pain or any other concerns

## 2023-07-06 NOTE — ED Triage Notes (Signed)
 Woke up out of sleep d/t RLQ pain w/ chills, n/v.

## 2023-07-06 NOTE — ED Notes (Signed)
 ED Provider at bedside.

## 2023-07-06 NOTE — ED Provider Notes (Signed)
 [place] EMERGENCY DEPARTMENT AT [facility] Provider Note   CSN: [id number] Arrival date & time: 07/06/23  2130     History  Chief Complaint  Patient presents with   Abdominal Pain    Heather Ewing is a 33 y.o. female.   Abdominal Pain Associated symptoms: chills, nausea and vomiting      33 year old female with medical history significant for obesity, polycystic ovaries, DM2, OSA, history of cholecystectomy, tubal ligation and vaginal hysterectomy who presents to the emergency department with abdominal pain, nausea and vomiting.  The patient states that she woke up this morning with right-sided abdominal pain.  She states that she does have a history of peptic ulcer disease and was treated for H. pylori in the past.  She drinks alcohol, drink 1 beer last night.  She endorses right upper quadrant and right sided abdominal pain with associated NBNB emesis.  She endorses chills, denies any fevers.  She denies any urinary symptoms.  She is passing gas.  Home Medications Prior to Admission medications   Medication Sig Start Date End Date Taking? Authorizing Provider  amoxicillin -clavulanate (AUGMENTIN ) 875-125 MG tablet Take 1 tablet by mouth every 12 (twelve) hours. 07/06/23  Yes Rosealee Concha, MD  MOUNJARO 15 MG/0.5ML Pen Inject 15 mg into the skin once a week. 06/01/23 05/31/24 Yes [provider]  oxyCODONE -acetaminophen  (PERCOCET/ROXICET) 5-325 MG tablet Take 1 tablet by mouth every 6 (six) hours as needed for severe pain (pain score 7-10). 07/06/23  Yes Rosealee Concha, MD  acetaminophen  (TYLENOL ) 500 MG tablet Take 1,000 mg by mouth every 6 (six) hours as needed for moderate pain.     [provider]  albuterol  (PROVENTIL  HFA;VENTOLIN  HFA) 108 (90 Base) MCG/ACT inhaler Inhale 1-2 puffs into the lungs every 6 (six) hours as needed for wheezing or shortness of breath.    [provider]  albuterol  (VENTOLIN  HFA) 108 (90 Base) MCG/ACT inhaler  Inhale 2 puffs into the lungs every 6 (six) hours as needed for wheezing or shortness of breath. 01/25/19   Dannial Duty, MD  cetirizine (ZYRTEC) 10 MG tablet Take 10 mg by mouth daily.  04/05/16   [provider]  Fluticasone-Salmeterol (ADVAIR) 250-50 MCG/DOSE AEPB Inhale 1 puff into the lungs 2 (two) times daily.    [provider]  lisdexamfetamine (VYVANSE) 60 MG capsule Take 60 mg by mouth every morning.    [provider]  metFORMIN (GLUCOPHAGE-XR) 500 MG 24 hr tablet Take 1,000 mg by mouth at bedtime.  07/07/17   [provider]  montelukast (SINGULAIR) 10 MG tablet Take 10 mg by mouth at bedtime.  02/16/17 09/04/18  [provider]  ondansetron  (ZOFRAN ) 8 MG tablet Take 8 mg by mouth every 8 (eight) hours as needed for nausea or vomiting.  01/15/16   [provider]  propranolol ER (INDERAL LA) 60 MG 24 hr capsule Take 60 mg by mouth daily.    [provider]      Allergies    Iodinated contrast media and Shellfish allergy    Review of Systems   Review of Systems  Constitutional:  Positive for chills.  Gastrointestinal:  Positive for abdominal pain, nausea and vomiting.  All other systems reviewed and are negative.   Physical Exam Updated Vital Signs BP 126/82 (BP Location: Right Arm)   Pulse 91   Temp 98.5 F (36.9 C) (Oral)   Resp 18   LMP 08/17/2018   SpO2 99%  Physical Exam Vitals and  nursing note reviewed.  Constitutional:      General: She is not in acute distress.    Appearance: She is well-developed.  HENT:     Head: Normocephalic and atraumatic.  Eyes:     Conjunctiva/sclera: Conjunctivae normal.  Cardiovascular:     Rate and Rhythm: Normal rate and regular rhythm.     Heart sounds: No murmur heard. Pulmonary:     Effort: Pulmonary effort is normal. No respiratory distress.     Breath sounds: Normal breath sounds.  Abdominal:     Palpations: Abdomen is soft.     Tenderness: There is abdominal  tenderness in the right upper quadrant and right lower quadrant. There is guarding. Negative signs include Murphy's sign.  Musculoskeletal:        General: No swelling.     Cervical back: Neck supple.  Skin:    General: Skin is warm and dry.     Capillary Refill: Capillary refill takes less than 2 seconds.  Neurological:     Mental Status: She is alert.  Psychiatric:        Mood and Affect: Mood normal.     ED Results / Procedures / Treatments   Labs (all labs ordered are listed, but only abnormal results are displayed) Labs Reviewed  CBC WITH DIFFERENTIAL/PLATELET - Abnormal; Notable for the following components:      Result Value   WBC 14.3 (*)    Hemoglobin 15.7 (*)    Neutro Abs 10.9 (*)    All other components within normal limits  COMPREHENSIVE METABOLIC PANEL WITH GFR - Abnormal; Notable for the following components:   CO2 20 (*)    Calcium 10.4 (*)    ALT 54 (*)    Anion gap 17 (*)    All other components within normal limits  URINALYSIS, ROUTINE W REFLEX MICROSCOPIC - Abnormal; Notable for the following components:   Protein, ur TRACE (*)    Bacteria, UA RARE (*)    All other components within normal limits  BASIC METABOLIC PANEL WITH GFR - Abnormal; Notable for the following components:   CO2 21 (*)    All other components within normal limits  LIPASE, BLOOD  PREGNANCY, URINE    EKG EKG Interpretation Date/Time:  Wednesday Jul 06 2023 08:44:50 EDT Ventricular Rate:  104 PR Interval:  127 QRS Duration:  89 QT Interval:  339 QTC Calculation: 446 R Axis:   78  Text Interpretation: Sinus tachycardia Consider anterior infarct Confirmed by Rosealee Concha ([id number]) on 07/06/2023 9:00:52 AM  Radiology CT ABDOMEN PELVIS WO CONTRAST Result Date: 07/06/2023 CLINICAL DATA:  Right lower quadrant pain, nausea and vomiting, chills EXAM: CT ABDOMEN AND PELVIS WITHOUT CONTRAST TECHNIQUE: Multidetector CT imaging of the abdomen and pelvis was performed following the standard  protocol without IV contrast. Unenhanced CT was performed per clinician order. Lack of IV contrast limits sensitivity and specificity, especially for evaluation of abdominal/pelvic solid viscera. RADIATION DOSE REDUCTION: This exam was performed according to the departmental dose-optimization program which includes automated exposure control, adjustment of the mA and/or kV according to patient size and/or use of iterative reconstruction technique. COMPARISON:  None Available. FINDINGS: Lower chest: No acute pleural or parenchymal lung disease. Hepatobiliary: Cholecystectomy. Unremarkable unenhanced appearance of the liver. No biliary duct dilation. Pancreas: Unremarkable unenhanced appearance. Spleen: Unremarkable unenhanced appearance. Adrenals/Urinary Tract: No urinary tract calculi or obstructive uropathy within either kidney. The adrenals and bladder are unremarkable. Stomach/Bowel: No bowel obstruction or ileus. Normal appendix right lower quadrant.  There is mild wall thickening of the terminal ileum with minimal mesenteric fat stranding, consistent with inflammatory or infectious ileitis. Vascular/Lymphatic: No significant vascular findings are present. No enlarged abdominal or pelvic lymph nodes. Reproductive: Status post hysterectomy. No adnexal masses. Other: Trace pelvic free fluid. No free intraperitoneal gas. No abdominal wall hernia. Musculoskeletal: No acute or destructive bony abnormalities. Reconstructed images demonstrate no additional findings. IMPRESSION: 1. Mild terminal ileal wall thickening and surrounding mesenteric edema, consistent with infectious ileitis versus inflammatory bowel disease. 2. Normal appendix. 3. Trace pelvic free fluid, likely reactive. Electronically Signed   By: Bobbye Burrow M.D.   On: 07/06/2023 09:41    Procedures Procedures    Medications Ordered in ED Medications  sodium chloride  0.9 % bolus 1,000 mL (0 mLs Intravenous Stopped 07/06/23 1043)  ondansetron   (ZOFRAN ) injection 4 mg (4 mg Intravenous Given 07/06/23 0917)  famotidine  (PEPCID ) IVPB 20 mg premix (0 mg Intravenous Stopped 07/06/23 1043)  fentaNYL  (SUBLIMAZE ) injection 50 mcg (50 mcg Intravenous Given 07/06/23 0938)  oxyCODONE -acetaminophen  (PERCOCET/ROXICET) 5-325 MG per tablet 1 tablet (1 tablet Oral Given 07/06/23 1254)  lactated ringers  bolus 1,000 mL (0 mLs Intravenous Stopped 07/06/23 1359)    ED Course/ Medical Decision Making/ A&P                                 Medical Decision Making Amount and/or Complexity of Data Reviewed Labs: ordered. Radiology: ordered.  Risk Prescription drug management.    33 year old female with medical history significant for obesity, polycystic ovaries, DM2, OSA, history of cholecystectomy, tubal ligation and vaginal hysterectomy who presents to the emergency department with abdominal pain, nausea and vomiting.  The patient states that she woke up this morning with right-sided abdominal pain.  She states that she does have a history of peptic ulcer disease and was treated for H. pylori in the past.  She drinks alcohol, drink 1 beer last night.  She endorses right upper quadrant and right sided abdominal pain with associated NBNB emesis.  She endorses chills, denies any fevers.  She denies any urinary symptoms.  She is passing gas.  Medical Decision Making:   Heather Ewing is a 33 y.o. female who presented to the ED today with abdominal pain, detailed above.     Complete initial physical exam performed, notably the patient  was tender to palpation in the right hemi-abdomen.     Reviewed and confirmed nursing documentation for past medical history, family history, social history.    Initial Assessment:   With the patient's presentation of abdominal pain, most likely diagnosis is intraabdominal infection. Other diagnoses were considered including (but not limited to) gastroenteritis, colitis, small bowel obstruction, appendicitis, pancreatitis,  nephrolithiasis, UTI, pyelonephritis, diverticulitis.    Initial Plan:  CBC/CMP to evaluate for underlying infectious/metabolic etiology for patient's abdominal pain  Lipase to evaluate for pancreatitis  EKG to evaluate for cardiac source of pain  CT Ab/pelvis without contrast due to patient's contrast allergy Urinalysis and repeat physical assessment to evaluate for UTI/Pyelonpehritis  Empiric management of symptoms with escalating pain control and antiemetics as needed.   Initial Study Results:   Laboratory  All laboratory results reviewed without evidence of clinically relevant pathology.   Exceptions include: Leukocytosis to 14.3, evidence of hemoconcentration with a hemoglobin of 15.7, mild anion gap acidosis with a bicarbonate of 20, anion gap of 17, urinalysis without convincing evidence for UTI, urine pregnancy negative, lipase normal  EKG EKG was reviewed independently. Rate, rhythm, axis, intervals all examined and without medically relevant abnormality. ST segments without concerns for elevations.    Radiology All images reviewed independently. Agree with radiology report at this time.   CT ABDOMEN PELVIS WO CONTRAST Result Date: 07/06/2023 CLINICAL DATA:  Right lower quadrant pain, nausea and vomiting, chills EXAM: CT ABDOMEN AND PELVIS WITHOUT CONTRAST TECHNIQUE: Multidetector CT imaging of the abdomen and pelvis was performed following the standard protocol without IV contrast. Unenhanced CT was performed per clinician order. Lack of IV contrast limits sensitivity and specificity, especially for evaluation of abdominal/pelvic solid viscera. RADIATION DOSE REDUCTION: This exam was performed according to the departmental dose-optimization program which includes automated exposure control, adjustment of the mA and/or kV according to patient size and/or use of iterative reconstruction technique. COMPARISON:  None Available. FINDINGS: Lower chest: No acute pleural or parenchymal lung  disease. Hepatobiliary: Cholecystectomy. Unremarkable unenhanced appearance of the liver. No biliary duct dilation. Pancreas: Unremarkable unenhanced appearance. Spleen: Unremarkable unenhanced appearance. Adrenals/Urinary Tract: No urinary tract calculi or obstructive uropathy within either kidney. The adrenals and bladder are unremarkable. Stomach/Bowel: No bowel obstruction or ileus. Normal appendix right lower quadrant. There is mild wall thickening of the terminal ileum with minimal mesenteric fat stranding, consistent with inflammatory or infectious ileitis. Vascular/Lymphatic: No significant vascular findings are present. No enlarged abdominal or pelvic lymph nodes. Reproductive: Status post hysterectomy. No adnexal masses. Other: Trace pelvic free fluid. No free intraperitoneal gas. No abdominal wall hernia. Musculoskeletal: No acute or destructive bony abnormalities. Reconstructed images demonstrate no additional findings. IMPRESSION: 1. Mild terminal ileal wall thickening and surrounding mesenteric edema, consistent with infectious ileitis versus inflammatory bowel disease. 2. Normal appendix. 3. Trace pelvic free fluid, likely reactive. Electronically Signed   By: Bobbye Burrow M.D.   On: 07/06/2023 09:41      Final Reassessment and Plan:   Patient was overall symptomatically feeling improved, administered oral Percocet, evidence of colitis seen on CT, no other acute abnormality.  Patient fluid resuscitated, heart rate improved to the low 80s on recheck.  Considered for admission for observation versus discharge and the patient is tolerating p.o., comfortable with plan for continued outpatient management, will discharge on opiates for pain control, Augmentin, advised continue oral rehydration, PCP follow-up, return precautions in the event of any severe worsening symptoms.   Final Clinical Impression(s) / ED Diagnoses Final diagnoses:  Generalized abdominal pain  Colitis    Rx / DC  Orders ED Discharge Orders          Ordered    amoxicillin-clavulanate (AUGMENTIN) 875-125 MG tablet  Every 12 hours        07/06/23 1242    oxyCODONE -acetaminophen  (PERCOCET/ROXICET) 5-325 MG tablet  Every 6 hours PRN        07/06/23 1242              Rosealee Concha, MD 07/06/23 [phone]

## 2023-08-12 ENCOUNTER — Telehealth: Admitting: Physician Assistant

## 2023-08-12 ENCOUNTER — Other Ambulatory Visit: Payer: Self-pay

## 2023-08-12 ENCOUNTER — Telehealth

## 2023-08-12 ENCOUNTER — Encounter: Payer: Self-pay | Admitting: Physician Assistant

## 2023-08-12 ENCOUNTER — Emergency Department (HOSPITAL_BASED_OUTPATIENT_CLINIC_OR_DEPARTMENT_OTHER)
Admission: EM | Admit: 2023-08-12 | Discharge: 2023-08-12 | Discharge status: Against Medical Advice | Attending: Emergency Medicine | Admitting: Emergency Medicine

## 2023-08-12 DIAGNOSIS — B379 Candidiasis, unspecified: Secondary | ICD-10-CM | POA: Diagnosis not present

## 2023-08-12 DIAGNOSIS — R058 Other specified cough: Secondary | ICD-10-CM | POA: Diagnosis present

## 2023-08-12 DIAGNOSIS — J029 Acute pharyngitis, unspecified: Secondary | ICD-10-CM | POA: Insufficient documentation

## 2023-08-12 DIAGNOSIS — B9689 Other specified bacterial agents as the cause of diseases classified elsewhere: Secondary | ICD-10-CM

## 2023-08-12 DIAGNOSIS — J4531 Mild persistent asthma with (acute) exacerbation: Secondary | ICD-10-CM

## 2023-08-12 DIAGNOSIS — Z5321 Procedure and treatment not carried out due to patient leaving prior to being seen by health care provider: Secondary | ICD-10-CM | POA: Insufficient documentation

## 2023-08-12 DIAGNOSIS — T3695XA Adverse effect of unspecified systemic antibiotic, initial encounter: Secondary | ICD-10-CM

## 2023-08-12 DIAGNOSIS — J019 Acute sinusitis, unspecified: Secondary | ICD-10-CM | POA: Diagnosis not present

## 2023-08-12 LAB — RESP PANEL BY RT-PCR (RSV, FLU A&B, COVID)  RVPGX2
Influenza A by PCR: NEGATIVE
Influenza B by PCR: NEGATIVE
Resp Syncytial Virus by PCR: NEGATIVE
SARS Coronavirus 2 by RT PCR: NEGATIVE

## 2023-08-12 LAB — GROUP A STREP BY PCR: Group A Strep by PCR: NOT DETECTED

## 2023-08-12 MED ORDER — PREDNISONE 10 MG (21) PO TBPK: ORAL_TABLET | ORAL | 0 refills | Status: AC

## 2023-08-12 MED ORDER — ALBUTEROL SULFATE HFA 108 (90 BASE) MCG/ACT IN AERS: 1.0000 | INHALATION_SPRAY | Freq: Four times a day (QID) | RESPIRATORY_TRACT | 0 refills | Status: DC | PRN

## 2023-08-12 MED ORDER — ALBUTEROL SULFATE HFA 108 (90 BASE) MCG/ACT IN AERS: 1.0000 | INHALATION_SPRAY | Freq: Four times a day (QID) | RESPIRATORY_TRACT | 0 refills | Status: AC | PRN

## 2023-08-12 MED ORDER — FLUCONAZOLE 150 MG PO TABS: 150.0000 mg | ORAL_TABLET | ORAL | 0 refills | Status: DC | PRN

## 2023-08-12 MED ORDER — AMOXICILLIN-POT CLAVULANATE 875-125 MG PO TABS: 1.0000 | ORAL_TABLET | Freq: Two times a day (BID) | ORAL | 0 refills | Status: AC

## 2023-08-12 MED ORDER — FLUCONAZOLE 150 MG PO TABS: 150.0000 mg | ORAL_TABLET | ORAL | 0 refills | Status: AC | PRN

## 2023-08-12 MED ORDER — PREDNISONE 10 MG (21) PO TBPK: ORAL_TABLET | ORAL | 0 refills | Status: DC

## 2023-08-12 NOTE — ED Triage Notes (Signed)
 Pt reports productive yellow cough and sore throat ongoing since Tuesday.

## 2023-08-12 NOTE — Patient Instructions (Signed)
 Heather Ewing, thank you for joining Heather CHRISTELLA Dickinson, PA-C for today's virtual visit.  While this provider is not your primary care provider (PCP), if your PCP is located in our provider database this encounter information will be shared with them immediately following your visit.   A [facility] MyChart account gives you access to today's visit and all your visits, tests, and labs performed at [facility]  click here if you don't have a [facility] MyChart account or go to [email or web address]  Consent: (Patient) Heather Ewing provided verbal consent for this virtual visit at the beginning of the encounter.  Current Medications:  Current Outpatient Medications:    albuterol  (VENTOLIN  HFA) 108 (90 Base) MCG/ACT inhaler, Inhale 1-2 puffs into the lungs every 6 (six) hours as needed., Disp: 8 g, Rfl: 0   amoxicillin -clavulanate (AUGMENTIN ) 875-125 MG tablet, Take 1 tablet by mouth 2 (two) times daily., Disp: 14 tablet, Rfl: 0   fluconazole  (DIFLUCAN ) 150 MG tablet, Take 1 tablet (150 mg total) by mouth every 3 (three) days as needed., Disp: 2 tablet, Rfl: 0   predniSONE  (STERAPRED UNI-PAK 21 TAB) 10 MG (21) TBPK tablet, 6 day taper; take as directed on package instructions, Disp: 21 tablet, Rfl: 0   acetaminophen  (TYLENOL ) 500 MG tablet, Take 1,000 mg by mouth every 6 (six) hours as needed for moderate pain. , Disp: , Rfl:    cetirizine (ZYRTEC) 10 MG tablet, Take 10 mg by mouth daily. , Disp: , Rfl: 0   Fluticasone-Salmeterol (ADVAIR) 250-50 MCG/DOSE AEPB, Inhale 1 puff into the lungs 2 (two) times daily., Disp: , Rfl:    lisdexamfetamine (VYVANSE) 60 MG capsule, Take 60 mg by mouth every morning., Disp: , Rfl:    metFORMIN (GLUCOPHAGE-XR) 500 MG 24 hr tablet, Take 1,000 mg by mouth at bedtime. , Disp: , Rfl:    montelukast (SINGULAIR) 10 MG tablet, Take 10 mg by mouth at bedtime. , Disp: , Rfl:    MOUNJARO 15 MG/0.5ML Pen, Inject 15 mg into the skin once a week.,  Disp: , Rfl:    ondansetron  (ZOFRAN ) 8 MG tablet, Take 8 mg by mouth every 8 (eight) hours as needed for nausea or vomiting. , Disp: , Rfl: 0   oxyCODONE -acetaminophen  (PERCOCET/ROXICET) 5-325 MG tablet, Take 1 tablet by mouth every 6 (six) hours as needed for severe pain (pain score 7-10)., Disp: 15 tablet, Rfl: 0   propranolol ER (INDERAL LA) 60 MG 24 hr capsule, Take 60 mg by mouth daily., Disp: , Rfl:    Medications ordered in this encounter:  Meds ordered this encounter  Medications   amoxicillin -clavulanate (AUGMENTIN ) 875-125 MG tablet    Sig: Take 1 tablet by mouth 2 (two) times daily.    Dispense:  14 tablet    Refill:  0    Supervising Provider:   BLAISE ALEENE KIDD [[id number]]   predniSONE  (STERAPRED UNI-PAK 21 TAB) 10 MG (21) TBPK tablet    Sig: 6 day taper; take as directed on package instructions    Dispense:  21 tablet    Refill:  0    Supervising Provider:   LAMPTEY, PHILIP O [[id number]]   albuterol  (VENTOLIN  HFA) 108 (90 Base) MCG/ACT inhaler    Sig: Inhale 1-2 puffs into the lungs every 6 (six) hours as needed.    Dispense:  8 g    Refill:  0    Supervising Provider:   BLAISE ALEENE KIDD [[id number]]   fluconazole  (DIFLUCAN ) 150 MG tablet  Sig: Take 1 tablet (150 mg total) by mouth every 3 (three) days as needed.    Dispense:  2 tablet    Refill:  0    Supervising Provider:   LAMPTEY, PHILIP O [[id number]]     *If you need refills on other medications prior to your next appointment, please contact your pharmacy*  Follow-Up: Call back or seek an in-person evaluation if the symptoms worsen or if the condition fails to improve as anticipated.  [facility] [phone]  Other Instructions Sinus Infection, Adult A sinus infection, also called sinusitis, is inflammation of your sinuses. Sinuses are hollow spaces in the bones around your face. Your sinuses are located: Around your eyes. In the middle of your forehead. Behind your nose. In your  cheekbones. Mucus normally drains out of your sinuses. When your nasal tissues become inflamed or swollen, mucus can become trapped or blocked. This allows bacteria, viruses, and fungi to grow, which leads to infection. Most infections of the sinuses are caused by a virus. A sinus infection can develop quickly. It can last for up to 4 weeks (acute) or for more than 12 weeks (chronic). A sinus infection often develops after a cold. What are the causes? This condition is caused by anything that creates swelling in the sinuses or stops mucus from draining. This includes: Allergies. Asthma. Infection from bacteria or viruses. Deformities or blockages in your nose or sinuses. Abnormal growths in the nose (nasal polyps). Pollutants, such as chemicals or irritants in the air. Infection from fungi. This is rare. What increases the risk? You are more likely to develop this condition if you: Have a weak body defense system (immune system). Do a lot of swimming or diving. Overuse nasal sprays. Smoke. What are the signs or symptoms? The main symptoms of this condition are pain and a feeling of pressure around the affected sinuses. Other symptoms include: Stuffy nose or congestion that makes it difficult to breathe through your nose. Thick yellow or greenish drainage from your nose. Tenderness, swelling, and warmth over the affected sinuses. A cough that may get worse at night. Decreased sense of smell and taste. Extra mucus that collects in the throat or the back of the nose (postnasal drip) causing a sore throat or bad breath. Tiredness (fatigue). Fever. How is this diagnosed? This condition is diagnosed based on: Your symptoms. Your medical history. A physical exam. Tests to find out if your condition is acute or chronic. This may include: Checking your nose for nasal polyps. Viewing your sinuses using a device that has a light (endoscope). Testing for allergies or bacteria. Imaging tests,  such as an MRI or CT scan. In rare cases, a bone biopsy may be done to rule out more serious types of fungal sinus disease. How is this treated? Treatment for a sinus infection depends on the cause and whether your condition is chronic or acute. If caused by a virus, your symptoms should go away on their own within 10 days. You may be given medicines to relieve symptoms. They include: Medicines that shrink swollen nasal passages (decongestants). A spray that eases inflammation of the nostrils (topical intranasal corticosteroids). Rinses that help get rid of thick mucus in your nose (nasal saline washes). Medicines that treat allergies (antihistamines). Over-the-counter pain relievers. If caused by bacteria, your health care provider may recommend waiting to see if your symptoms improve. Most bacterial infections will get better without antibiotic medicine. You may be given antibiotics if you have: A  severe infection. A weak immune system. If caused by narrow nasal passages or nasal polyps, surgery may be needed. Follow these instructions at home: Medicines Take, use, or apply over-the-counter and prescription medicines only as told by your health care provider. These may include nasal sprays. If you were prescribed an antibiotic medicine, take it as told by your health care provider. Do not stop taking the antibiotic even if you start to feel better. Hydrate and humidify  Drink enough fluid to keep your urine pale yellow. Staying hydrated will help to thin your mucus. Use a cool mist humidifier to keep the humidity level in your home above 50%. Inhale steam for 10-15 minutes, 3-4 times a day, or as told by your health care provider. You can do this in the bathroom while a hot shower is running. Limit your exposure to cool or dry air. Rest Rest as much as possible. Sleep with your head raised (elevated). Make sure you get enough sleep each night. General instructions  Apply a warm, moist  washcloth to your face 3-4 times a day or as told by your health care provider. This will help with discomfort. Use nasal saline washes as often as told by your health care provider. Wash your hands often with soap and water to reduce your exposure to germs. If soap and water are not available, use hand sanitizer. Do not smoke. Avoid being around people who are smoking (secondhand smoke). Keep all follow-up visits. This is important. Contact a health care provider if: You have a fever. Your symptoms get worse. Your symptoms do not improve within 10 days. Get help right away if: You have a severe headache. You have persistent vomiting. You have severe pain or swelling around your face or eyes. You have vision problems. You develop confusion. Your neck is stiff. You have trouble breathing. These symptoms may be an emergency. Get help right away. Call 911. Do not wait to see if the symptoms will go away. Do not drive yourself to the hospital. Summary A sinus infection is soreness and inflammation of your sinuses. Sinuses are hollow spaces in the bones around your face. This condition is caused by nasal tissues that become inflamed or swollen. The swelling traps or blocks the flow of mucus. This allows bacteria, viruses, and fungi to grow, which leads to infection. If you were prescribed an antibiotic medicine, take it as told by your health care provider. Do not stop taking the antibiotic even if you start to feel better. Keep all follow-up visits. This is important. This information is not intended to replace advice given to you by your health care provider. Make sure you discuss any questions you have with your health care provider. Document Revised: 12/30/2020 Document Reviewed: 12/30/2020 Elsevier Patient Education  2024 [employer]vier [employer]   If you have been instructed to have an in-person evaluation today at a local Urgent Care facility, please use the link below. It will take you to a  list of all of our available [facility], including address, phone number and hours of operation. Please do not delay care.  [facility]  If you or a family member do not have a primary care provider, use the link below to schedule a visit and establish care. When you choose a [facility] primary care physician or advanced practice provider, you gain a long-term partner in health. Find a Primary Care Provider  Learn more about [facility]'s in-office and virtual care options: [facility] -  Get Care Now

## 2023-08-12 NOTE — Addendum Note (Signed)
 Addended by: VIVIENNE DELON HERO on: 08/12/2023 09:00 AM   Modules accepted: Orders

## 2023-08-12 NOTE — ED Notes (Signed)
 No answer for room assignment

## 2023-08-12 NOTE — Progress Notes (Signed)
 Virtual Visit Consent   Heather Ewing, you are scheduled for a virtual visit with a [facility] provider today. Just as with appointments in the office, your consent must be obtained to participate. Your consent will be active for this visit and any virtual visit you may have with one of our providers in the next 365 days. If you have a MyChart account, a copy of this consent can be sent to you electronically.  As this is a virtual visit, video technology does not allow for your provider to perform a traditional examination. This may limit your provider's ability to fully assess your condition. If your provider identifies any concerns that need to be evaluated in person or the need to arrange testing (such as labs, EKG, etc.), we will make arrangements to do so. Although advances in technology are sophisticated, we cannot ensure that it will always work on either your end or our end. If the connection with a video visit is poor, the visit may have to be switched to a telephone visit. With either a video or telephone visit, we are not always able to ensure that we have a secure connection.  By engaging in this virtual visit, you consent to the provision of healthcare and authorize for your insurance to be billed (if applicable) for the services provided during this visit. Depending on your insurance coverage, you may receive a charge related to this service.  I need to obtain your verbal consent now. Are you willing to proceed with your visit today? OHANA BIRDWELL has provided verbal consent on 08/12/2023 for a virtual visit (video or telephone). Delon CHRISTELLA Dickinson, PA-C  Date: 08/12/2023 8:38 AM   Virtual Visit via Video Note   I, Delon CHRISTELLA Dickinson, connected with  Heather Ewing  ([id number], 1991-02-05) on 08/12/23 at  8:30 AM EDT by a video-enabled telemedicine application and verified that I am speaking with the correct person using two identifiers.  Location: Patient: Virtual Visit  Location Patient: Home Provider: Virtual Visit Location Provider: Home Office   I discussed the limitations of evaluation and management by telemedicine and the availability of in person appointments. The patient expressed understanding and agreed to proceed.    History of Present Illness: Heather Ewing is a 33 y.o. who identifies as a female who was assigned female at birth, and is being seen today for sinus congestion and wheezing.  HPI: URI  This is a new problem. The current episode started in the past 7 days. The problem has been gradually worsening. Maximum temperature: subjective fever. Associated symptoms include congestion, coughing, ear pain, headaches, a plugged ear sensation, rhinorrhea (and post nasal drainage), sinus pain, a sore throat, swollen glands and wheezing. Pertinent negatives include no chest pain. Treatments tried: neti pot, allergy medications, dayquil, mucinex . The treatment provided no relief.   Did test negative for strep and Covid at ER last night but left after triage  PMH: asthma   Problems:  Patient Active Problem List   Diagnosis Date Noted   Postoperative state 09/04/2018   Women's annual routine gynecological examination 04/07/2016   History of abnormal cervical Pap smear 04/07/2016    Allergies:  Allergies  Allergen Reactions   Iodinated Contrast Media Nausea And Vomiting    Contrast Dye   Shellfish Allergy Nausea Only    Betadine OK   Medications:  Current Outpatient Medications:    albuterol  (VENTOLIN  HFA) 108 (90 Base) MCG/ACT inhaler, Inhale 1-2 puffs into the lungs every 6 (  six) hours as needed., Disp: 8 g, Rfl: 0   amoxicillin -clavulanate (AUGMENTIN ) 875-125 MG tablet, Take 1 tablet by mouth 2 (two) times daily., Disp: 14 tablet, Rfl: 0   fluconazole  (DIFLUCAN ) 150 MG tablet, Take 1 tablet (150 mg total) by mouth every 3 (three) days as needed., Disp: 2 tablet, Rfl: 0   predniSONE  (STERAPRED UNI-PAK 21 TAB) 10 MG (21) TBPK tablet, 6  day taper; take as directed on package instructions, Disp: 21 tablet, Rfl: 0   acetaminophen  (TYLENOL ) 500 MG tablet, Take 1,000 mg by mouth every 6 (six) hours as needed for moderate pain. , Disp: , Rfl:    cetirizine (ZYRTEC) 10 MG tablet, Take 10 mg by mouth daily. , Disp: , Rfl: 0   Fluticasone-Salmeterol (ADVAIR) 250-50 MCG/DOSE AEPB, Inhale 1 puff into the lungs 2 (two) times daily., Disp: , Rfl:    lisdexamfetamine (VYVANSE) 60 MG capsule, Take 60 mg by mouth every morning., Disp: , Rfl:    metFORMIN (GLUCOPHAGE-XR) 500 MG 24 hr tablet, Take 1,000 mg by mouth at bedtime. , Disp: , Rfl:    montelukast (SINGULAIR) 10 MG tablet, Take 10 mg by mouth at bedtime. , Disp: , Rfl:    MOUNJARO 15 MG/0.5ML Pen, Inject 15 mg into the skin once a week., Disp: , Rfl:    ondansetron  (ZOFRAN ) 8 MG tablet, Take 8 mg by mouth every 8 (eight) hours as needed for nausea or vomiting. , Disp: , Rfl: 0   oxyCODONE -acetaminophen  (PERCOCET/ROXICET) 5-325 MG tablet, Take 1 tablet by mouth every 6 (six) hours as needed for severe pain (pain score 7-10)., Disp: 15 tablet, Rfl: 0   propranolol ER (INDERAL LA) 60 MG 24 hr capsule, Take 60 mg by mouth daily., Disp: , Rfl:   Observations/Objective: Patient is well-developed, well-nourished in no acute distress.  Resting comfortably at home.  Head is normocephalic, atraumatic.  No labored breathing.  Speech is clear and coherent with logical content.  Patient is alert and oriented at baseline.    Assessment and Plan: 1. Acute bacterial sinusitis (Primary) - amoxicillin -clavulanate (AUGMENTIN ) 875-125 MG tablet; Take 1 tablet by mouth 2 (two) times daily.  Dispense: 14 tablet; Refill: 0  2. Mild persistent asthma with acute exacerbation - predniSONE  (STERAPRED UNI-PAK 21 TAB) 10 MG (21) TBPK tablet; 6 day taper; take as directed on package instructions  Dispense: 21 tablet; Refill: 0 - albuterol  (VENTOLIN  HFA) 108 (90 Base) MCG/ACT inhaler; Inhale 1-2 puffs into the  lungs every 6 (six) hours as needed.  Dispense: 8 g; Refill: 0  3. Antibiotic-induced yeast infection - fluconazole  (DIFLUCAN ) 150 MG tablet; Take 1 tablet (150 mg total) by mouth every 3 (three) days as needed.  Dispense: 2 tablet; Refill: 0  - Worsening symptoms that have not responded to OTC medications.  - Will give Augmentin  and Prednisone  - Refilled albuterol  inhaler - Continue allergy medications.  - Steam and humidifier can help - Stay well hydrated and get plenty of rest.  - Diflucan  given as prophylaxis as patient tends to get vaginal yeast infections with antibiotic use - Seek in person evaluation if no symptom improvement or if symptoms worsen   Follow Up Instructions: I discussed the assessment and treatment plan with the patient. The patient was provided an opportunity to ask questions and all were answered. The patient agreed with the plan and demonstrated an understanding of the instructions.  A copy of instructions were sent to the patient via MyChart unless otherwise noted below.  The patient was advised to call back or seek an in-person evaluation if the symptoms worsen or if the condition fails to improve as anticipated.    Delon CHRISTELLA Dickinson, PA-C
